# Patient Record
Sex: Female | Born: 1990 | Race: Black or African American | Hispanic: No | Marital: Single | Smoking: Never smoker
Health system: Southern US, Community
[De-identification: ages and names within clinical notes are randomized; demographics above are authoritative.]

## PROBLEM LIST (undated history)

## (undated) DIAGNOSIS — Z789 Other specified health status: Secondary | ICD-10-CM

## (undated) DIAGNOSIS — O24419 Gestational diabetes mellitus in pregnancy, unspecified control: Secondary | ICD-10-CM

## (undated) HISTORY — PX: NO PAST SURGERIES: SHX2092

## (undated) HISTORY — DX: Gestational diabetes mellitus in pregnancy, unspecified control: O24.419

## (undated) HISTORY — DX: Other specified health status: Z78.9

---

## 2018-06-03 ENCOUNTER — Encounter: Payer: Self-pay | Admitting: Family Medicine

## 2018-06-03 ENCOUNTER — Ambulatory Visit (INDEPENDENT_AMBULATORY_CARE_PROVIDER_SITE_OTHER): Payer: 59 | Admitting: *Deleted

## 2018-06-03 DIAGNOSIS — Z3200 Encounter for pregnancy test, result unknown: Secondary | ICD-10-CM

## 2018-06-03 DIAGNOSIS — Z3201 Encounter for pregnancy test, result positive: Secondary | ICD-10-CM

## 2018-06-03 DIAGNOSIS — Z3480 Encounter for supervision of other normal pregnancy, unspecified trimester: Secondary | ICD-10-CM

## 2018-06-03 LAB — POCT PREGNANCY, URINE: PREG TEST UR: POSITIVE — AB

## 2018-06-03 MED ORDER — PRENATAL 27-0.8 MG PO TABS: 1.0000 | ORAL_TABLET | Freq: Every day | ORAL | 9 refills | Status: DC

## 2018-06-03 NOTE — Progress Notes (Signed)
I have reviewed the chart and agree with nursing staff's documentation of this patient's encounter.  Jaynie Collins, MD 06/03/2018 10:07 AM

## 2018-06-03 NOTE — Progress Notes (Signed)
Here for upt which was positive. LMP 04/01/18 , states is sure of that period and periods are regular. This makes her [redacted]w[redacted]d with EDD 01/25/19. Would like to start care with one of our locations.Revewed meds- she is not on any.  Sent prenatal vitamins rx.

## 2018-07-08 ENCOUNTER — Ambulatory Visit: Payer: 59 | Admitting: *Deleted

## 2018-07-08 ENCOUNTER — Other Ambulatory Visit: Payer: Self-pay

## 2018-07-08 ENCOUNTER — Encounter: Payer: Self-pay | Admitting: General Practice

## 2018-07-08 DIAGNOSIS — Z3480 Encounter for supervision of other normal pregnancy, unspecified trimester: Secondary | ICD-10-CM | POA: Insufficient documentation

## 2018-07-08 LAB — POCT URINALYSIS DIPSTICK OB
Bilirubin, UA: NEGATIVE
Glucose, UA: NEGATIVE
Ketones, UA: NEGATIVE
NITRITE UA: NEGATIVE
PH UA: 7 (ref 5.0–8.0)
PROTEIN: NEGATIVE
Spec Grav, UA: 1.02 (ref 1.010–1.025)
UROBILINOGEN UA: 1 U/dL

## 2018-07-08 MED ORDER — VITAFOL GUMMIES 3.33-0.333-34.8 MG PO CHEW: 3.0000 | CHEWABLE_TABLET | Freq: Every day | ORAL | 12 refills | Status: DC

## 2018-07-08 NOTE — Patient Instructions (Signed)
   Genetic Screening Results Information: You are having genetic testing called [facility] today.  It will take approximately 2 weeks before the results are available.  To get your results, you need Internet access to a web browser to search [place]/MyChart (the direct app on your phone will not give you these results).  Then select Lab Scanned and click on the blue hyper link that says View Image to see your [facility] results.  You can also use the directions on the purple card given to look up your results directly on the [facility] website.  

## 2018-07-08 NOTE — Progress Notes (Signed)
   PRENATAL INTAKE SUMMARY  Becky Cochran presents today New OB Nurse Interview.  OB History    Gravida  4   Para  2   Term  2   Preterm      AB  1   Living  2     SAB      TAB      Ectopic      Multiple      Live Births  2          I have reviewed the patient's medical, obstetrical, social, and family histories, medications, and available lab results.  SUBJECTIVE She complains of headache and visual changes at least 4 times last week. Pt did not take any medication for headache relief. Denies headache and visual changes today.  Pt has history of gestational diabetes.   OBJECTIVE Initial nurse interview for history and lab work (New OB). BP elevated today (labs completed).  Blood pressure 133/86, pulse 88, temperature 98.3 F (36.8 C), height 5\' 6"  (1.676 m), weight 180 lb (81.6 kg), last menstrual period 04/20/2018.   EDD: 01/25/2019 by LMP GA: [redacted]w[redacted]d G4P2012  GENERAL APPEARANCE: alert, well appearing, in no apparent distress, oriented to person, place and time, well hydrated.   ASSESSMENT Normal pregnancy.  PLAN Prenatal care-[facility] Pnl/HIV  OB Urine Culture/Dip GC/CT/PAP at next visit with Midwife HgbEval SMA/CF ([facility]) [facility] A1C  CHTN - P/C Ratio and CMP U/S complete less <14 weeks to confirm dating and viability Prenatal Rx for gummies sent to pharmacy Pt to sign up for MyChart Advised on preterm labor pains, severe headaches, visual changes, dizziness. Pt to go to [facility].  Genetic Screening Results Information: You are having genetic testing called [facility] today.  It will take approximately 2 weeks before the results are available.  To get your results, you need Internet access to a web browser to search [place]/MyChart (the direct app on your phone will not give you these results).  Then select Lab Scanned and click on the blue hyper link that says View Image to see your [facility] results.  You can also use the directions on  the purple card given to look up your results directly on the [facility] website.  Becky Cochran, Becky Cochran L, RN

## 2018-07-09 ENCOUNTER — Encounter: Payer: Self-pay | Admitting: General Practice

## 2018-07-09 LAB — OBSTETRIC PANEL, INCLUDING HIV
Antibody Screen: NEGATIVE
BASOS ABS: 0 10*3/uL (ref 0.0–0.2)
Basos: 0 %
EOS (ABSOLUTE): 0.1 10*3/uL (ref 0.0–0.4)
Eos: 1 %
HEMATOCRIT: 34.7 % (ref 34.0–46.6)
HEP B S AG: NEGATIVE
HIV Screen 4th Generation wRfx: NONREACTIVE
Hemoglobin: 11.3 g/dL (ref 11.1–15.9)
IMMATURE GRANS (ABS): 0 10*3/uL (ref 0.0–0.1)
Immature Granulocytes: 0 %
LYMPHS: 17 %
Lymphocytes Absolute: 1.4 10*3/uL (ref 0.7–3.1)
MCH: 28.3 pg (ref 26.6–33.0)
MCHC: 32.6 g/dL (ref 31.5–35.7)
MCV: 87 fL (ref 79–97)
MONOCYTES: 5 %
Monocytes Absolute: 0.4 10*3/uL (ref 0.1–0.9)
Neutrophils Absolute: 6.3 10*3/uL (ref 1.4–7.0)
Neutrophils: 77 %
PLATELETS: 270 10*3/uL (ref 150–450)
RBC: 3.99 x10E6/uL (ref 3.77–5.28)
RDW: 13.1 % (ref 12.3–15.4)
RPR: NONREACTIVE
RUBELLA: 5.62 {index} (ref >0.99)
Rh Factor: POSITIVE
WBC: 8.2 10*3/uL (ref 3.4–10.8)

## 2018-07-09 LAB — COMPREHENSIVE METABOLIC PANEL
ALT: 8 IU/L (ref 0–32)
AST: 14 IU/L (ref 0–40)
Albumin/Globulin Ratio: 1.7 (ref 1.2–2.2)
Albumin: 4.1 g/dL (ref 3.5–5.5)
Alkaline Phosphatase: 43 IU/L (ref 39–117)
BUN/Creatinine Ratio: 11 (ref 9–23)
BUN: 7 mg/dL (ref 6–20)
Bilirubin Total: 0.6 mg/dL (ref 0.0–1.2)
CALCIUM: 9.3 mg/dL (ref 8.7–10.2)
CO2: 21 mmol/L (ref 20–29)
CREATININE: 0.61 mg/dL (ref 0.57–1.00)
Chloride: 99 mmol/L (ref 96–106)
GFR calc Af Amer: 146 mL/min/{1.73_m2} (ref >59)
GFR, EST NON AFRICAN AMERICAN: 126 mL/min/{1.73_m2} (ref >59)
GLUCOSE: 90 mg/dL (ref 65–99)
Globulin, Total: 2.4 g/dL (ref 1.5–4.5)
POTASSIUM: 3.9 mmol/L (ref 3.5–5.2)
Sodium: 135 mmol/L (ref 134–144)
Total Protein: 6.5 g/dL (ref 6.0–8.5)

## 2018-07-09 LAB — PROTEIN / CREATININE RATIO, URINE
Creatinine, Urine: 126.2 mg/dL
PROTEIN UR: 11.2 mg/dL
PROTEIN/CREAT RATIO: 89 mg/g{creat} (ref 0–200)

## 2018-07-09 LAB — HEMOGLOBIN A1C
ESTIMATED AVERAGE GLUCOSE: 108 mg/dL
Hgb A1c MFr Bld: 5.4 % (ref 4.8–5.6)

## 2018-07-09 LAB — SICKLE CELL SCREEN: Sickle Cell Screen: NEGATIVE

## 2018-07-10 ENCOUNTER — Other Ambulatory Visit: Payer: Self-pay | Admitting: Obstetrics and Gynecology

## 2018-07-10 ENCOUNTER — Ambulatory Visit (HOSPITAL_COMMUNITY): Payer: 59

## 2018-07-10 ENCOUNTER — Ambulatory Visit (HOSPITAL_COMMUNITY)
Admission: RE | Admit: 2018-07-10 | Discharge: 2018-07-10 | Disposition: A | Discharge status: Home / Self Care | Payer: 59 | Source: Ambulatory Visit | Attending: Obstetrics and Gynecology | Admitting: Obstetrics and Gynecology

## 2018-07-10 DIAGNOSIS — Z3480 Encounter for supervision of other normal pregnancy, unspecified trimester: Secondary | ICD-10-CM

## 2018-07-10 DIAGNOSIS — Z3481 Encounter for supervision of other normal pregnancy, first trimester: Secondary | ICD-10-CM | POA: Diagnosis not present

## 2018-07-10 DIAGNOSIS — Z3A12 12 weeks gestation of pregnancy: Secondary | ICD-10-CM | POA: Insufficient documentation

## 2018-07-10 LAB — URINE CULTURE, OB REFLEX

## 2018-07-10 LAB — CULTURE, OB URINE

## 2018-07-16 ENCOUNTER — Encounter: Payer: Self-pay | Admitting: General Practice

## 2018-07-17 ENCOUNTER — Encounter: Payer: Medicaid Other | Admitting: Obstetrics and Gynecology

## 2018-07-17 ENCOUNTER — Telehealth: Payer: Self-pay | Admitting: General Practice

## 2018-07-17 NOTE — Telephone Encounter (Signed)
Patient left message to reschedule appt that was scheduled for this morning.  Called patient to reschedule, but no answer.  Left message for patient to give our office a call back to reschedule.

## 2018-07-18 ENCOUNTER — Encounter: Payer: Self-pay | Admitting: General Practice

## 2018-07-19 ENCOUNTER — Other Ambulatory Visit (HOSPITAL_COMMUNITY)
Admission: RE | Admit: 2018-07-19 | Discharge: 2018-07-19 | Disposition: A | Discharge status: Home / Self Care | Payer: 59 | Source: Ambulatory Visit | Attending: Family | Admitting: Family

## 2018-07-19 ENCOUNTER — Ambulatory Visit (INDEPENDENT_AMBULATORY_CARE_PROVIDER_SITE_OTHER): Payer: 59 | Admitting: Family

## 2018-07-19 ENCOUNTER — Encounter: Payer: Self-pay | Admitting: Family

## 2018-07-19 ENCOUNTER — Encounter: Payer: Self-pay | Admitting: General Practice

## 2018-07-19 DIAGNOSIS — Z3482 Encounter for supervision of other normal pregnancy, second trimester: Secondary | ICD-10-CM | POA: Diagnosis not present

## 2018-07-19 DIAGNOSIS — Z3480 Encounter for supervision of other normal pregnancy, unspecified trimester: Secondary | ICD-10-CM | POA: Diagnosis not present

## 2018-07-19 NOTE — Patient Instructions (Addendum)
Considering Waterbirth? Guide for patients at [facility]  Why consider waterbirth?  . Gentle birth for babies . Less pain medicine used in labor . May allow for passive descent/less pushing . May reduce perineal tears  . More mobility and instinctive maternal position changes . Increased maternal relaxation . Reduced blood pressure in labor  Is waterbirth safe? What are the risks of infection, drowning or other complications?  . Infection: o Very low risk (3.7 % for tub vs 4.8% for bed) o 7 in 8000 waterbirths with documented infection o Poorly cleaned equipment most common cause o Slightly lower group B strep transmission rate  . Drowning o Maternal:  - Very low risk   - Related to seizures or fainting o Newborn:  - Very low risk. No evidence of increased risk of respiratory problems in multiple large studies - Physiological protection from breathing under water - Avoid underwater birth if there are any fetal complications - Once baby's head is out of the water, keep it out.  . Birth complication o Some reports of cord trauma, but risk decreased by bringing baby to surface gradually o No evidence of increased risk of shoulder dystocia. Mothers can usually change positions faster in water than in a bed, possibly aiding the maneuvers to free the shoulder.   You must attend a Doren Custard class at [facility]  3rd Wednesday of every month from 7-9pm  [employer] by calling [phone] or online at [email or web address]  Bring [place] the certificate from the class to your prenatal appointment  Meet with a midwife at 36 weeks to see if you can still plan a waterbirth and to sign the consent.   Purchase or rent the following supplies: You are responsible for providing all supplies listed above. **If you do not have all necessary supplies you cannot have a waterbirth.**   Water Birth Pool (Birth Pool in a Box or [place] for instance)  (Tubs start  ~$125)  Single-use disposable tub liner designed for your brand of tub  Electric drain pump to remove water (We recommend 792 gallon per hour or greater pump.)   New garden hose labeled "lead-free", "suitable for drinking water",  Separate garden hose to remove the dirty water  Fish net  Bathing suit top (optional)  Long-handled mirror (optional)  Places to purchase or rent supplies:   [employer]is for tub purchases and supplies  [employer].com for tub purchases and supplies  [employer] (www.thelaborladies.com) $275 for tub rental/set-up & take down/kit   [employer] (http://www.[place].com/.htm) Information regarding doulas (labor support) who provide pool rentals  Things that would prevent you from having a waterbirth:  Premature, <37wks  Previous cesarean birth  Presence of thick meconium-stained fluid  Multiple gestation (Twins, triplets, etc.)  Uncontrolled diabetes or gestational diabetes requiring medication  Hypertension requiring medication or diagnosis of pre-eclampsia  Heavy vaginal bleeding  Non-reassuring fetal heart rate  Active infection (MRSA, etc.). Group B Strep is NOT a contraindication for waterbirth.  If your labor has to be induced and induction method requires continuous monitoring of the baby's heart rate  Other risks/issues identified by your obstetrical provider  Please remember that birth is unpredictable. Under certain unforeseeable circumstances your provider may advise against giving birth in the tub. These decisions will be made on a case-by-case basis and with the safety of you and your baby as our highest priority.    Second Trimester of Pregnancy The second trimester is from week 14 through week  27 (months 4 through 6). The second trimester is often a time when you feel your best. Your body has adjusted to being pregnant, and you begin to feel better physically. Usually, morning  sickness has lessened or quit completely, you may have more energy, and you may have an increase in appetite. The second trimester is also a time when the fetus is growing rapidly. At the end of the sixth month, the fetus is about 9 inches long and weighs about 1 pounds. You will likely begin to feel the baby move (quickening) between 16 and 20 weeks of pregnancy. Body changes during your second trimester Your body continues to go through many changes during your second trimester. The changes vary from woman to woman.  Your weight will continue to increase. You will notice your lower abdomen bulging out.  You may begin to get stretch marks on your hips, abdomen, and breasts.  You may develop headaches that can be relieved by medicines. The medicines should be approved by your health care provider.  You may urinate more often because the fetus is pressing on your bladder.  You may develop or continue to have heartburn as a result of your pregnancy.  You may develop constipation because certain hormones are causing the muscles that push waste through your intestines to slow down.  You may develop hemorrhoids or swollen, bulging veins (varicose veins).  You may have back pain. This is caused by: ? Weight gain. ? Pregnancy hormones that are relaxing the joints in your pelvis. ? A shift in weight and the muscles that support your balance.  Your breasts will continue to grow and they will continue to become tender.  Your gums may bleed and may be sensitive to brushing and flossing.  Dark spots or blotches (chloasma, mask of pregnancy) may develop on your face. This will likely fade after the baby is born.  A dark line from your belly button to the pubic area (linea nigra) may appear. This will likely fade after the baby is born.  You may have changes in your hair. These can include thickening of your hair, rapid growth, and changes in texture. Some women also have hair loss during or after  pregnancy, or hair that feels dry or thin. Your hair will most likely return to normal after your baby is born. What to expect at prenatal visits During a routine prenatal visit:  You will be weighed to make sure you and the fetus are growing normally.  Your blood pressure will be taken.  Your abdomen will be measured to track your baby's growth.  The fetal heartbeat will be listened to.  Any test results from the previous visit will be discussed. Your health care provider may ask you:  How you are feeling.  If you are feeling the baby move.  If you have had any abnormal symptoms, such as leaking fluid, bleeding, severe headaches, or abdominal cramping.  If you are using any tobacco products, including cigarettes, chewing tobacco, and electronic cigarettes.  If you have any questions. Other tests that may be performed during your second trimester include:  Blood tests that check for: ? Low iron levels (anemia). ? High blood sugar that affects pregnant women (gestational diabetes) between 81 and 28 weeks. ? Rh antibodies. This is to check for a protein on red blood cells (Rh factor).  Urine tests to check for infections, diabetes, or protein in the urine.  An ultrasound to confirm the proper growth and development of  the baby.  An amniocentesis to check for possible genetic problems.  Fetal screens for spina bifida and Down syndrome.  HIV (human immunodeficiency virus) testing. Routine prenatal testing includes screening for HIV, unless you choose not to have this test. Follow these instructions at home: Medicines  Follow your health care provider's instructions regarding medicine use. Specific medicines may be either safe or unsafe to take during pregnancy.  Take a prenatal vitamin that contains at least 600 micrograms (mcg) of folic acid.  If you develop constipation, try taking a stool softener if your health care provider approves. Eating and drinking   Eat a  balanced diet that includes fresh fruits and vegetables, whole grains, good sources of protein such as meat, eggs, or tofu, and low-fat dairy. Your health care provider will help you determine the amount of weight gain that is right for you.  Avoid raw meat and uncooked cheese. These carry germs that can cause birth defects in the baby.  If you have low calcium intake from food, talk to your health care provider about whether you should take a daily calcium supplement.  Limit foods that are high in fat and processed sugars, such as fried and sweet foods.  To prevent constipation: ? Drink enough fluid to keep your urine clear or pale yellow. ? Eat foods that are high in fiber, such as fresh fruits and vegetables, whole grains, and beans. Activity  Exercise only as directed by your health care provider. Most women can continue their usual exercise routine during pregnancy. Try to exercise for 30 minutes at least 5 days a week. Stop exercising if you experience uterine contractions.  Avoid heavy lifting, wear low heel shoes, and practice good posture.  A sexual relationship may be continued unless your health care provider directs you otherwise. Relieving pain and discomfort  Wear a good support bra to prevent discomfort from breast tenderness.  Take warm sitz baths to soothe any pain or discomfort caused by hemorrhoids. Use hemorrhoid cream if your health care provider approves.  Rest with your legs elevated if you have leg cramps or low back pain.  If you develop varicose veins, wear support hose. Elevate your feet for 15 minutes, 3-4 times a day. Limit salt in your diet. Prenatal Care  Write down your questions. Take them to your prenatal visits.  Keep all your prenatal visits as told by your health care provider. This is important. Safety  Wear your seat belt at all times when driving.  Make a list of emergency phone numbers, including numbers for family, friends, the hospital,  and police and fire departments. General instructions  Ask your health care provider for a referral to a local prenatal education class. Begin classes no later than the beginning of month 6 of your pregnancy.  Ask for help if you have counseling or nutritional needs during pregnancy. Your health care provider can offer advice or refer you to specialists for help with various needs.  Do not use hot tubs, steam rooms, or saunas.  Do not douche or use tampons or scented sanitary pads.  Do not cross your legs for long periods of time.  Avoid cat litter boxes and soil used by cats. These carry germs that can cause birth defects in the baby and possibly loss of the fetus by miscarriage or stillbirth.  Avoid all smoking, herbs, alcohol, and unprescribed drugs. Chemicals in these products can affect the formation and growth of the baby.  Do not use any products that  contain nicotine or tobacco, such as cigarettes and e-cigarettes. If you need help quitting, ask your health care provider.  Visit your dentist if you have not gone yet during your pregnancy. Use a soft toothbrush to brush your teeth and be gentle when you floss. Contact a health care provider if:  You have dizziness.  You have mild pelvic cramps, pelvic pressure, or nagging pain in the abdominal area.  You have persistent nausea, vomiting, or diarrhea.  You have a bad smelling vaginal discharge.  You have pain when you urinate. Get help right away if:  You have a fever.  You are leaking fluid from your vagina.  You have spotting or bleeding from your vagina.  You have severe abdominal cramping or pain.  You have rapid weight gain or weight loss.  You have shortness of breath with chest pain.  You notice sudden or extreme swelling of your face, hands, ankles, feet, or legs.  You have not felt your baby move in over an hour.  You have severe headaches that do not go away when you take medicine.  You have vision  changes. Summary  The second trimester is from week 14 through week 27 (months 4 through 6). It is also a time when the fetus is growing rapidly.  Your body goes through many changes during pregnancy. The changes vary from woman to woman.  Avoid all smoking, herbs, alcohol, and unprescribed drugs. These chemicals affect the formation and growth your baby.  Do not use any tobacco products, such as cigarettes, chewing tobacco, and e-cigarettes. If you need help quitting, ask your health care provider.  Contact your health care provider if you have any questions. Keep all prenatal visits as told by your health care provider. This is important. This information is not intended to replace advice given to you by your health care provider. Make sure you discuss any questions you have with your health care provider. Document Released: 07/11/2001 Document Revised: 08/22/2016 Document Reviewed: 08/22/2016 Elsevier Interactive Patient Education  2019 [employer].

## 2018-07-19 NOTE — Progress Notes (Signed)
  Subjective:    Becky Cochran is a [id number] 9439w6d being seen today for her first obstetrical visit. Here with [facility] [place].  Her obstetrical history is significant for gestational diabetes in 2nd pregnancy.  Both pregnancies term vaginal deliveries. Patient does intend to breast feed. Pregnancy history fully reviewed.  Patient reports no complaints, improved nausea.  Vitals:   07/19/18 0823  BP: 118/82  Pulse: 78  Weight: 182 lb 6.4 oz (82.7 kg)    HISTORY: OB History  Gravida Para Term Preterm AB Living  4 2 2   1 2   SAB TAB Ectopic Multiple Live Births          2    # Outcome Date GA Lbr Len/2nd Weight Sex Delivery Anes PTL Lv  4 Current           3 AB 10/2016          2 Term 12/14/13 7345w0d  8 lb 1 oz (3.657 kg) M Vag-Spont EPI N LIV  1 Term 06/25/12 915w0d  7 lb 8 oz (3.402 kg) F Vag-Spont EPI N LIV   Past Medical History:  Diagnosis Date  . Medical history non-contributory    Past Surgical History:  Procedure Laterality Date  . NO PAST SURGERIES     Family History  Problem Relation Age of Onset  . Diabetes Mother   . Heart disease Mother   . Kidney disease Mother   . Hypertension Mother   . Cancer Maternal Grandmother   . Cancer Paternal Grandmother      Exam    BP 118/82   Pulse 78   Wt 182 lb 6.4 oz (82.7 kg)   LMP 04/20/2018 (Exact Date)   BMI 29.44 kg/m  Uterine Size: size equals dates  Pelvic Exam:    Perineum: No Hemorrhoids, Normal Perineum   Vulva: normal   Vagina:  normal mucosa, normal discharge, no palpable nodules   pH: Not done   Cervix: no bleeding following Pap, no cervical motion tenderness and no lesions   Adnexa: normal adnexa and no mass, fullness, tenderness   Bony Pelvis: Adequate  System: Breast:  No nipple retraction or dimpling, No nipple discharge or bleeding, No axillary or supraclavicular adenopathy, Normal to palpation without dominant masses   Skin: normal coloration and turgor, no rashes    Neurologic: negative   Extremities: normal strength, tone, and muscle mass   HEENT neck supple with midline trachea and thyroid without masses   Mouth/Teeth mucous membranes moist, pharynx normal without lesions   Neck supple and no masses   Cardiovascular: regular rate and rhythm, no murmurs or gallops   Respiratory:  appears well, vitals normal, no respiratory distress, acyanotic, normal RR, neck free of mass or lymphadenopathy, chest clear, no wheezing, crepitations, rhonchi, normal symmetric air entry   Abdomen: soft, non-tender; bowel sounds normal; no masses,  no organomegaly   Urinary: urethral meatus normal      Assessment:    Pregnancy: [id number] Patient Active Problem List   Diagnosis Date Noted  . Supervision of other normal pregnancy, antepartum 07/08/2018        Plan:     Reviewed initial OB labs and [facility] results Prenatal vitamins. Problem list reviewed and updated.  Ultrasound discussed; fetal survey: results reviewed.  Follow up in 4 weeks.  Eino FarberWalidah N Karim-Rhoades 07/19/2018

## 2018-07-22 ENCOUNTER — Other Ambulatory Visit: Payer: Self-pay | Admitting: Family

## 2018-07-22 DIAGNOSIS — O2340 Unspecified infection of urinary tract in pregnancy, unspecified trimester: Secondary | ICD-10-CM

## 2018-07-22 MED ORDER — NITROFURANTOIN MONOHYD MACRO 100 MG PO CAPS: 100.0000 mg | ORAL_CAPSULE | Freq: Two times a day (BID) | ORAL | 0 refills | Status: DC

## 2018-07-25 ENCOUNTER — Telehealth: Payer: Self-pay | Admitting: *Deleted

## 2018-07-25 NOTE — Telephone Encounter (Signed)
-----   Message from Amedeo GoryWalidah N Karim-Rhoades, [place] sent at 07/22/2018  6:20 PM EST ----- Regarding: UTI Meds Please call and let her know a RX has been sent to pharmacy for UTI.  I thought I already did this.

## 2018-07-26 LAB — CYTOLOGY - PAP
Chlamydia: NEGATIVE
Diagnosis: NEGATIVE
NEISSERIA GONORRHEA: NEGATIVE

## 2018-07-31 NOTE — L&D Delivery Note (Signed)
OB/[facility]N Faculty Practice Delivery Note  Becky Cochran is a 28 y.o. [id number] s/p SVD at [redacted]w[redacted]d. She was admitted for induction of labor for NRNST.   ROM: 0h 45m with clear fluid GBS Status: positive (adequately treated with PCN) Maximum Maternal Temperature: Temp (24hrs), Avg:98.6 F (37 C), Min:98.3 F (36.8 C), Max:99 F (37.2 C)  Labor Progress: . FB placed . Buccal cytotec . AROM clear fluid at 7cm then quick progression to complete  Delivery Date/Time: 01/30/19 at 0258 Delivery: Called to room and patient was complete and pushing. Head delivered LOA. No nuchal cord present. Shoulder and body delivered in usual fashion. Infant with spontaneous cry, placed on mother's abdomen, dried and stimulated. Cord clamped x 2 after 1-minute delay, and cut by father of baby. Cord blood drawn. Placenta delivered spontaneously with gentle cord traction. Fundus firm with massage and Pitocin. Labia, perineum, vagina, and cervix inspected inspected with no lacerations.   Placenta: spontaneous, intact, 3-vessel cord (to be discarded) Complications: none immediate Lacerations: none EBL: 114cc per triton Analgesia: none  Postpartum Planning [x]  message to sent to schedule follow-up  [x]  vaccines UTD  Infant: Vigorous female  APGARs 8, 46  3019g  [name] S. Juleen China, DO OB/GYN Fellow, Faculty Practice

## 2018-08-05 ENCOUNTER — Ambulatory Visit: Payer: 59 | Admitting: *Deleted

## 2018-08-05 ENCOUNTER — Other Ambulatory Visit (HOSPITAL_COMMUNITY)
Admission: RE | Admit: 2018-08-05 | Discharge: 2018-08-05 | Disposition: A | Discharge status: Home / Self Care | Payer: 59 | Source: Ambulatory Visit | Attending: Obstetrics and Gynecology | Admitting: Obstetrics and Gynecology

## 2018-08-05 VITALS — BP 121/74 | HR 80 | Temp 98.4°F | Ht 66.0 in | Wt 181.2 lb

## 2018-08-05 DIAGNOSIS — N898 Other specified noninflammatory disorders of vagina: Secondary | ICD-10-CM

## 2018-08-05 DIAGNOSIS — O26899 Other specified pregnancy related conditions, unspecified trimester: Secondary | ICD-10-CM | POA: Insufficient documentation

## 2018-08-05 NOTE — Progress Notes (Signed)
   SUBJECTIVE:  28 y.o. female complains of clear and foul vaginal discharge for 10 day(s). Denies abnormal vaginal bleeding or significant pelvic pain or fever. No UTI symptoms. Denies history of known exposure to STD.  Patient's last menstrual period was 04/20/2018 (exact date).  OBJECTIVE:  She appears well, afebrile. Urine dipstick: not done.  ASSESSMENT:  Vaginal Discharge  Vaginal Odor   PLAN:  GC, chlamydia, trichomonas, BVAG, CVAG probe sent to lab. Treatment: To be determined once lab results are received ROV prn if symptoms persist or worsen.   Clovis PuMartin, Braeden Dolinski L, RN

## 2018-08-06 LAB — CERVICOVAGINAL ANCILLARY ONLY
Bacterial vaginitis: POSITIVE — AB
Candida vaginitis: POSITIVE — AB
Chlamydia: NEGATIVE
Neisseria Gonorrhea: NEGATIVE
Trichomonas: NEGATIVE

## 2018-08-08 ENCOUNTER — Telehealth: Payer: Self-pay | Admitting: *Deleted

## 2018-08-08 DIAGNOSIS — B9689 Other specified bacterial agents as the cause of diseases classified elsewhere: Secondary | ICD-10-CM

## 2018-08-08 DIAGNOSIS — B3731 Acute candidiasis of vulva and vagina: Secondary | ICD-10-CM

## 2018-08-08 DIAGNOSIS — B373 Candidiasis of vulva and vagina: Secondary | ICD-10-CM

## 2018-08-08 DIAGNOSIS — O23599 Infection of other part of genital tract in pregnancy, unspecified trimester: Principal | ICD-10-CM

## 2018-08-08 MED ORDER — METRONIDAZOLE 500 MG PO TABS: 500.0000 mg | ORAL_TABLET | Freq: Two times a day (BID) | ORAL | 0 refills | Status: DC

## 2018-08-08 MED ORDER — TERCONAZOLE 0.4 % VA CREA: 1.0000 | TOPICAL_CREAM | Freq: Every day | VAGINAL | 0 refills | Status: DC

## 2018-08-08 NOTE — Telephone Encounter (Signed)
-----   Message from Marti Sleigh, [place] sent at 08/08/2018  9:42 AM EST ----- Regarding: test results Wants test results

## 2018-08-08 NOTE — Telephone Encounter (Signed)
Patient called requesting lab results from 08/05/2018.  Pt verified DOB. Patient result positive for BV and yeast. Pt also stated she is starting to have UTI symptoms; painful urination. Appt scheduled for nurse visit tomorrow 08/09/2018. Metronidazole 500 mg 1 tab BID with food and Terazol vaginal cream sent to pharmacy.  Clovis Pu, RN

## 2018-08-09 ENCOUNTER — Ambulatory Visit: Payer: 59

## 2018-08-15 ENCOUNTER — Encounter (HOSPITAL_COMMUNITY): Payer: Self-pay | Admitting: *Deleted

## 2018-08-15 ENCOUNTER — Emergency Department (HOSPITAL_COMMUNITY): Payer: 59

## 2018-08-15 ENCOUNTER — Emergency Department (HOSPITAL_COMMUNITY)
Admission: EM | Admit: 2018-08-15 | Discharge: 2018-08-15 | Disposition: A | Discharge status: Home / Self Care | Payer: 59 | Attending: Emergency Medicine | Admitting: Emergency Medicine

## 2018-08-15 ENCOUNTER — Other Ambulatory Visit: Payer: Self-pay

## 2018-08-15 DIAGNOSIS — N3000 Acute cystitis without hematuria: Secondary | ICD-10-CM | POA: Insufficient documentation

## 2018-08-15 DIAGNOSIS — R52 Pain, unspecified: Secondary | ICD-10-CM

## 2018-08-15 DIAGNOSIS — Z79899 Other long term (current) drug therapy: Secondary | ICD-10-CM | POA: Insufficient documentation

## 2018-08-15 DIAGNOSIS — R109 Unspecified abdominal pain: Secondary | ICD-10-CM

## 2018-08-15 DIAGNOSIS — R1011 Right upper quadrant pain: Secondary | ICD-10-CM | POA: Diagnosis present

## 2018-08-15 LAB — COMPREHENSIVE METABOLIC PANEL
ALK PHOS: 41 U/L (ref 38–126)
ALT: 11 U/L (ref 0–44)
AST: 16 U/L (ref 15–41)
Albumin: 3.4 g/dL — ABNORMAL LOW (ref 3.5–5.0)
Anion gap: 9 (ref 5–15)
BILIRUBIN TOTAL: 0.8 mg/dL (ref 0.3–1.2)
BUN: 10 mg/dL (ref 6–20)
CO2: 25 mmol/L (ref 22–32)
Calcium: 8.9 mg/dL (ref 8.9–10.3)
Chloride: 101 mmol/L (ref 98–111)
Creatinine, Ser: 0.62 mg/dL (ref 0.44–1.00)
GFR calc Af Amer: >60 mL/min (ref >60)
GFR calc non Af Amer: >60 mL/min (ref >60)
Glucose, Bld: 88 mg/dL (ref 70–99)
Potassium: 3.5 mmol/L (ref 3.5–5.1)
Sodium: 135 mmol/L (ref 135–145)
Total Protein: 7 g/dL (ref 6.5–8.1)

## 2018-08-15 LAB — CBC WITH DIFFERENTIAL/PLATELET
Abs Immature Granulocytes: 0.12 10*3/uL — ABNORMAL HIGH (ref 0.00–0.07)
Basophils Absolute: 0 10*3/uL (ref 0.0–0.1)
Basophils Relative: 0 %
Eosinophils Absolute: 0.2 10*3/uL (ref 0.0–0.5)
Eosinophils Relative: 1 %
HCT: 35.5 % — ABNORMAL LOW (ref 36.0–46.0)
Hemoglobin: 11.4 g/dL — ABNORMAL LOW (ref 12.0–15.0)
Immature Granulocytes: 1 %
LYMPHS ABS: 1.9 10*3/uL (ref 0.7–4.0)
Lymphocytes Relative: 14 %
MCH: 28.8 pg (ref 26.0–34.0)
MCHC: 32.1 g/dL (ref 30.0–36.0)
MCV: 89.6 fL (ref 80.0–100.0)
Monocytes Absolute: 0.8 10*3/uL (ref 0.1–1.0)
Monocytes Relative: 6 %
NRBC: 0 % (ref 0.0–0.2)
Neutro Abs: 10.6 10*3/uL — ABNORMAL HIGH (ref 1.7–7.7)
Neutrophils Relative %: 78 %
Platelets: 266 10*3/uL (ref 150–400)
RBC: 3.96 MIL/uL (ref 3.87–5.11)
RDW: 12.8 % (ref 11.5–15.5)
WBC: 13.6 10*3/uL — ABNORMAL HIGH (ref 4.0–10.5)

## 2018-08-15 LAB — HCG, QUANTITATIVE, PREGNANCY: hCG, Beta Chain, Quant, S: 27706 m[IU]/mL — ABNORMAL HIGH (ref <5)

## 2018-08-15 LAB — URINALYSIS, ROUTINE W REFLEX MICROSCOPIC
Bilirubin Urine: NEGATIVE
Glucose, UA: NEGATIVE mg/dL
Ketones, ur: NEGATIVE mg/dL
Nitrite: NEGATIVE
Protein, ur: 30 mg/dL — AB
Specific Gravity, Urine: 1.013 (ref 1.005–1.030)
WBC, UA: >50 WBC/hpf — ABNORMAL HIGH (ref 0–5)
pH: 6 (ref 5.0–8.0)

## 2018-08-15 LAB — LIPASE, BLOOD: Lipase: 38 U/L (ref 11–51)

## 2018-08-15 LAB — I-STAT BETA HCG BLOOD, ED (MC, WL, AP ONLY): I-stat hCG, quantitative: >2000 m[IU]/mL — ABNORMAL HIGH (ref <5)

## 2018-08-15 MED ORDER — SODIUM CHLORIDE 0.9 % IV SOLN
INTRAVENOUS | Status: DC | PRN
  Administered 2018-08-15: 500 mL via INTRAVENOUS

## 2018-08-15 MED ORDER — CEPHALEXIN 500 MG PO CAPS: 500.0000 mg | ORAL_CAPSULE | Freq: Four times a day (QID) | ORAL | 0 refills | Status: AC

## 2018-08-15 MED ORDER — SODIUM CHLORIDE 0.9 % IV SOLN
1.0000 g | Freq: Once | INTRAVENOUS | Status: AC
  Administered 2018-08-15: 1 g via INTRAVENOUS
  Filled 2018-08-15: qty 10

## 2018-08-15 MED ORDER — ACETAMINOPHEN 325 MG PO TABS
650.0000 mg | ORAL_TABLET | Freq: Once | ORAL | Status: AC
  Administered 2018-08-15: 650 mg via ORAL
  Filled 2018-08-15: qty 2

## 2018-08-15 MED ORDER — SODIUM CHLORIDE 0.9 % IV BOLUS
1000.0000 mL | Freq: Once | INTRAVENOUS | Status: AC
  Administered 2018-08-15: 1000 mL via INTRAVENOUS

## 2018-08-15 MED ORDER — MORPHINE SULFATE (PF) 4 MG/ML IV SOLN: 4.0000 mg | Freq: Once | INTRAVENOUS | Status: DC

## 2018-08-15 NOTE — ED Provider Notes (Signed)
[facility]-EMERGENCY DEPT Provider Note   CSN: [id number] Arrival date & time: 08/15/18  0530   History   Chief Complaint Chief Complaint  Patient presents with  . Flank Pain    right    HPI Becky Cochran is a 28 y.o. female with past medical history significant for pregnancy [redacted]w[redacted]d who presents for evaluation of flank and RUQ abdominal pain. Patient states she was awoken at 3am this morning with right sided flank pain that radiates into her upper abdomen. Rates her pain a 6/10. States movement and laying on her side makes the pain worse. Has not tried anything for her symptoms PTA. Denies hx of kidney stones and gallbladder issues. States she has never had similar symptoms. Denies fever, chills, nausea, vomiting, chest pain, shortness of breath, abdominal cramping, lower abdominal pain, vaginal bleeding, pelvic pain, dysuria or diarrhea.  Of note, patient states she was treated approximately 9 days ago for urinary tract infection with macrobid.  Patient states she denies symptoms at that time it was made aware of her positive urinalysis at her prenatal visit. Denies previous abdominal surgeries.   History obtained by patient and significant other.  No interpreter was used.  HPI  Past Medical History:  Diagnosis Date  . Medical history non-contributory     Patient Active Problem List   Diagnosis Date Noted  . Supervision of other normal pregnancy, antepartum 07/08/2018    Past Surgical History:  Procedure Laterality Date  . NO PAST SURGERIES       OB History    Gravida  4   Para  2   Term  2   Preterm      AB  1   Living  2     SAB      TAB      Ectopic      Multiple      Live Births  2            Home Medications    Prior to Admission medications   Medication Sig Start Date End Date Taking? Authorizing Provider  Prenatal Vit-Fe Phos-FA-Omega (VITAFOL GUMMIES) 3.33-0.333-34.8 MG CHEW Chew 3 each by mouth daily. 07/08/18  Yes  Arita Miss, Rolitta, CNM  cephALEXin (KEFLEX) 500 MG capsule Take 1 capsule (500 mg total) by mouth 4 (four) times daily for 10 days. 08/15/18 08/25/18  Petrita Blunck A, PA-C  metroNIDAZOLE (FLAGYL) 500 MG tablet Take 1 tablet (500 mg total) by mouth 2 (two) times daily. Patient not taking: Reported on 08/15/2018 08/08/18   Amedeo Gory, CNM  nitrofurantoin, macrocrystal-monohydrate, (MACROBID) 100 MG capsule Take 1 capsule (100 mg total) by mouth 2 (two) times daily. Patient not taking: Reported on 08/15/2018 07/22/18   Amedeo Gory, CNM  Prenatal Vit-Fe Fumarate-FA (MULTIVITAMIN-PRENATAL) 27-0.8 MG TABS tablet Take 1 tablet by mouth daily at 12 noon. Patient not taking: Reported on 08/15/2018 06/03/18   Tereso Newcomer, MD  terconazole (TERAZOL 7) 0.4 % vaginal cream Place 1 applicator vaginally at bedtime. Patient not taking: Reported on 08/15/2018 08/08/18   Amedeo Gory, CNM    Family History Family History  Problem Relation Age of Onset  . Diabetes Mother   . Heart disease Mother   . Kidney disease Mother   . Hypertension Mother   . Cancer Maternal Grandmother   . Cancer Paternal Grandmother     Social History Social History   Tobacco Use  . Smoking status: Never Smoker  . Smokeless tobacco: Never  Used  Substance Use Topics  . Alcohol use: Not Currently    Comment: socially  . Drug use: Never     Allergies   Patient has no known allergies.   Review of Systems Review of Systems  Constitutional: Negative.   HENT: Negative.   Respiratory: Negative.   Cardiovascular: Negative.   Gastrointestinal: Negative for abdominal distention, abdominal pain, anal bleeding, blood in stool, constipation, diarrhea, nausea, rectal pain and vomiting.  Genitourinary: Positive for flank pain.  Skin: Negative.   Neurological: Negative.   All other systems reviewed and are negative.    Physical Exam Updated Vital Signs BP 120/65   Pulse 76   Temp 98.2  F (36.8 C) (Oral)   Resp 18   Ht 5\' 6"  (1.676 m)   Wt 82.1 kg   LMP 04/20/2018 (Exact Date)   SpO2 98%   BMI 29.21 kg/m   Physical Exam Vitals signs and nursing note reviewed.  Constitutional:      General: She is not in acute distress.    Appearance: She is well-developed. She is not ill-appearing, toxic-appearing or diaphoretic.  HENT:     Head: Normocephalic and atraumatic.     Nose: Nose normal.     Right Sinus: No maxillary sinus tenderness or frontal sinus tenderness.     Left Sinus: No maxillary sinus tenderness or frontal sinus tenderness.     Mouth/Throat:     Lips: Pink.     Mouth: Mucous membranes are moist.     Pharynx: Oropharynx is clear. Uvula midline. No pharyngeal swelling, oropharyngeal exudate, posterior oropharyngeal erythema or uvula swelling.     Tonsils: No tonsillar exudate or tonsillar abscesses. Swelling: 0 on the right. 0 on the left.  Eyes:     Pupils: Pupils are equal, round, and reactive to light.  Neck:     Musculoskeletal: Full passive range of motion without pain, normal range of motion and neck supple.     Trachea: Trachea and phonation normal.  Cardiovascular:     Rate and Rhythm: Normal rate.     Pulses: Normal pulses.     Heart sounds: Normal heart sounds. No murmur. No friction rub. No gallop.   Pulmonary:     Effort: Pulmonary effort is normal. No respiratory distress.     Breath sounds: Normal breath sounds and air entry.     Comments: CTA without wheeze, rhonchi or rales.  No evidence of acute respiratory distress. Abdominal:     General: Bowel sounds are normal. There is no distension.     Palpations: Abdomen is soft.     Tenderness: There is no abdominal tenderness. There is no right CVA tenderness, left CVA tenderness, guarding or rebound. Negative signs include Murphy's sign, Rovsing's sign, McBurney's sign, psoas sign and obturator sign.     Hernia: No hernia is present.     Comments: Soft with no rebound or guarding.  She has  mild right upper quadrant tenderness.  Negative CVA tenderness bilaterally.  Musculoskeletal: Normal range of motion.     Comments: Moves all extremities without difficulty. Ambulatory in department without difficulty.  Skin:    General: Skin is warm and dry.     Comments: No rashes or lesions  Neurological:     Mental Status: She is alert.      ED Treatments / Results  Labs (all labs ordered are listed, but only abnormal results are displayed) Labs Reviewed  URINALYSIS, ROUTINE W REFLEX MICROSCOPIC - Abnormal; Notable for the  following components:      Result Value   APPearance HAZY (*)    Hgb urine dipstick SMALL (*)    Protein, ur 30 (*)    Leukocytes, UA LARGE (*)    WBC, UA >50 (*)    Bacteria, UA RARE (*)    All other components within normal limits  HCG, QUANTITATIVE, PREGNANCY - Abnormal; Notable for the following components:   hCG, Beta Chain, Quant, S 27,706 (*)    All other components within normal limits  CBC WITH DIFFERENTIAL/PLATELET - Abnormal; Notable for the following components:   WBC 13.6 (*)    Hemoglobin 11.4 (*)    HCT 35.5 (*)    Neutro Abs 10.6 (*)    Abs Immature Granulocytes 0.12 (*)    All other components within normal limits  COMPREHENSIVE METABOLIC PANEL - Abnormal; Notable for the following components:   Albumin 3.4 (*)    All other components within normal limits  I-STAT BETA HCG BLOOD, ED ([facility], [facility], [facility] ONLY) - Abnormal; Notable for the following components:   I-stat hCG, quantitative >2,000.0 (*)    All other components within normal limits  URINE CULTURE  LIPASE, BLOOD    EKG None  Radiology [place] Renal  Result Date: 08/15/2018 CLINICAL DATA:  Pain.  Pregnancy. EXAM: RENAL / URINARY TRACT ULTRASOUND COMPLETE COMPARISON:  Ultrasound 07/10/2018 FINDINGS: Right Kidney: Renal measurements: 12.4 x 5.3 x 6.6 cm = volume: 272.2 mL . Echogenicity within normal limits. No mass. Mild right hydronephrosis. Hydronephrosis remains after voiding. Left  Kidney: Renal measurements: 11.6 x 5.2 x 6.1 cm = volume: 193.7 mL. Echogenicity within normal limits. No mass or hydronephrosis visualized. Bladder: Appears normal for degree of bladder distention. IMPRESSION: Mild right hydronephrosis. Electronically Signed   By: Maisie Fushomas  Register   On: 08/15/2018 09:26   [place] Abdomen Limited Ruq  Result Date: 08/15/2018 CLINICAL DATA:  Abdominal pain EXAM: ULTRASOUND ABDOMEN LIMITED RIGHT UPPER QUADRANT COMPARISON:  None. FINDINGS: Gallbladder: No gallstones or wall thickening visualized. There is no pericholecystic fluid. No sonographic Murphy sign noted by sonographer. Common bile duct: Diameter: 4 mm. No intrahepatic or extrahepatic biliary duct dilatation. Liver: No focal lesion identified. Within normal limits in parenchymal echogenicity. Portal vein is patent on color Doppler imaging with normal direction of blood flow towards the liver. IMPRESSION: Study within normal limits. Electronically Signed   By: Bretta BangWilliam  Woodruff III M.D.   On: 08/15/2018 09:42    Procedures Procedures (including critical care time)  Medications Ordered in ED Medications  0.9 %  sodium chloride infusion ( Intravenous Stopped 08/15/18 1126)  acetaminophen (TYLENOL) tablet 650 mg (650 mg Oral Given 08/15/18 0750)  sodium chloride 0.9 % bolus 1,000 mL (0 mLs Intravenous Stopped 08/15/18 1128)  cefTRIAXone (ROCEPHIN) 1 g in sodium chloride 0.9 % 100 mL IVPB (0 g Intravenous Stopped 08/15/18 1128)     Initial Impression / Assessment and Plan / ED Course  I have reviewed the triage vital signs and the nursing notes.  Pertinent labs & imaging results that were available during my care of the patient were reviewed by me and considered in my medical decision making (see chart for details).  28 year old female appears otherwise well presents for evaluation of right upper quadrant abdominal pain and flank pain. Onset 3 hours PTA. Pain worse with movement and laying on right side. Abdomen with  mild tenderness in RUQ. No rebound or guarding. Negative CVA tenderness. Recently treated for a UTI 9 days ago with macrobid.  Afebrile, non septic, non ill appearing. Will obtain labs, urine and re-evaluate.  She does not want anything for pain at this time.  Fetal heart tones 151.  No lower abdominal pain or vaginal bleeding.  Low suspicion for pyelonephritis given intermittent nature of pain.  CBC with leukocytosis 13.6, urinalysis positive for infection, Metabolic panel without electrolyte, renal or liver abnormalities, lipase 38, hCG 27,000.  Ultrasound right upper quadrant negative.  Ultrasound renal shows right-sided mild hydronephrosis. Given positive urinalysis for infection with possible stone-like symptoms concern for possible infected stone.  Will consult with urology for reevaluation.  Consulted with Urology, Dr. Mena Goes. Recommends close outpatient follow-up with 2-week course of Keflex.  Patient does not meet Sirs or sepsis criteria.  She is afebrile.  Patient states she would like to attempt outpatient management if possible. Dr. Mena Goes recommends strict return precautions. Abdomen soft, nontender without rebound or guarding on reevaluation.  No peritoneal signs.  Give patient liter of fluids as well as 1 g Rocephin IV.  Able to tolerate p.o. intake in department without difficulty.  Patient is hemodynamically stable and appropriate for DC home at this time.  Will DC patient home with 10-day course of Keflex.  Thoroughly discussed strict return precautions with patient and family member.  Patient and family voiced understanding and are agreeable for follow-up.  States she does not want any narcotics to take home.  She will take Tylenol as needed.  Will DC home with antibiotics and have patient follow-up closely with urology. Low suspicion for emergent pathology causing patient's symptoms at this time.    Final Clinical Impressions(s) / ED Diagnoses   Final diagnoses:  Pain  Flank pain    Acute cystitis without hematuria    ED Discharge Orders         Ordered    cephALEXin (KEFLEX) 500 MG capsule  4 times daily     08/15/18 1140           Zimri Brennen A, PA-C 08/15/18 1450    Mesner, Barbara Cower, MD 08/15/18 2309

## 2018-08-15 NOTE — ED Triage Notes (Signed)
Pt states she woke at around 3am with sharp flank pain.  Pt states that she was recently on antibiotics for a UTI and BV.  Pt is also [redacted] weeks pregnant.  Pt states that certain positions make the pain better.  Pt denies taking any medications.  Pt also denies any other symptoms.

## 2018-08-15 NOTE — Discharge Instructions (Signed)
Evaluated today for flank pain.  Urinalysis was positive for infection.  It is possible that you have an infected stone versus pyelonephritis.  I have given you antibiotics in department and a send you home with antibiotics.  You will need follow-up with urology.  Please return to emergency department if you develop fever, nausea, vomiting, worsening flank pain.

## 2018-08-17 LAB — URINE CULTURE: Culture: >=100000 — AB

## 2018-08-18 ENCOUNTER — Telehealth: Payer: Self-pay | Admitting: Emergency Medicine

## 2018-08-18 NOTE — Telephone Encounter (Signed)
Post ED Visit - Positive Culture Follow-up  Culture report reviewed by antimicrobial stewardship pharmacist:  []  Enzo Bi, Pharm.D. []  Celedonio Miyamoto, Pharm.D., BCPS AQ-ID []  Garvin Fila, Pharm.D., BCPS []  Georgina Pillion, Pharm.D., BCPS []  [place], [address].D., BCPS, AAHIVP []  Estella Husk, Pharm.D., BCPS, AAHIVP []  Lysle Pearl, PharmD, BCPS []  Phillips Climes, PharmD, BCPS []  Agapito Games, PharmD, BCPS [x]  Verlan Friends, PharmD  Positive urine culture Treated with Cephalexin, organism sensitive to the same and no further patient follow-up is required at this time.  Norm Parcel RN 08/18/2018, 1:25 PM

## 2018-08-23 ENCOUNTER — Ambulatory Visit (INDEPENDENT_AMBULATORY_CARE_PROVIDER_SITE_OTHER): Payer: 59 | Admitting: Obstetrics and Gynecology

## 2018-08-23 ENCOUNTER — Encounter: Payer: Self-pay | Admitting: Obstetrics and Gynecology

## 2018-08-23 VITALS — BP 120/84 | HR 69 | Wt 188.0 lb

## 2018-08-23 DIAGNOSIS — Z3482 Encounter for supervision of other normal pregnancy, second trimester: Secondary | ICD-10-CM

## 2018-08-23 DIAGNOSIS — Z3480 Encounter for supervision of other normal pregnancy, unspecified trimester: Secondary | ICD-10-CM

## 2018-08-23 NOTE — Progress Notes (Addendum)
   PRENATAL VISIT NOTE  Subjective:  Becky Cochran is a 28 y.o. [phone] at 614w6d being seen today for ongoing prenatal care.  She is currently monitored for the following issues for this low-risk pregnancy and has Supervision of other normal pregnancy, antepartum on their problem list.  Patient reports no complaints. Recently diagnosed with kidney stone and taking abx for that. She has no h/o kidney stone; this was the first diagnosis. She reports that the pain was really bad while she was in the ED, but "went away the next day."  Contractions: Not present. Vag. Bleeding: None.  Movement: Present. Denies leaking of fluid.   The following portions of the patient's history were reviewed and updated as appropriate: allergies, current medications, past family history, past medical history, past social history, past surgical history and problem list. Problem list updated.  Objective:   Vitals:   08/23/18 0833  BP: 120/84  Pulse: 69  Weight: 188 lb (85.3 kg)    Fetal Status: Fetal Heart Rate (bpm): 140 Fundal Height: 17 cm Movement: Present     General:  Alert, oriented and cooperative. Patient is in no acute distress.  Skin: Skin is warm and dry. No rash noted.   Cardiovascular: Normal heart rate noted  Respiratory: Normal respiratory effort, no problems with respiration noted  Abdomen: Soft, gravid, appropriate for gestational age.  Pain/Pressure: Absent     Pelvic: Cervical exam deferred        Extremities: Normal range of motion.  Edema: None  Mental Status: Normal mood and affect. Normal behavior. Normal judgment and thought content.   Assessment and Plan:  Pregnancy: [id number] at 3514w6d  1. Supervision of other normal pregnancy, antepartum - Discussed kidney stone passage - US MFM OB COMP + 14 WK; Future - Advised to call office for any further problems or concerns - Scheduled to see [facility] on Friday 08/31/2018  Preterm labor symptoms and general obstetric precautions  including but not limited to vaginal bleeding, contractions, leaking of fluid and fetal movement were reviewed in detail with the patient. Please refer to After Visit Summary for other counseling recommendations.  Return in about 4 weeks (around 09/20/2018) for Return OB visit.   Raelyn Moraolitta Henley Boettner, CNM

## 2018-08-23 NOTE — Patient Instructions (Signed)

## 2018-08-26 ENCOUNTER — Encounter (HOSPITAL_COMMUNITY): Payer: Self-pay

## 2018-08-28 ENCOUNTER — Encounter: Payer: Self-pay | Admitting: General Practice

## 2018-09-02 ENCOUNTER — Ambulatory Visit (HOSPITAL_COMMUNITY)
Admission: RE | Admit: 2018-09-02 | Discharge: 2018-09-02 | Disposition: A | Discharge status: Home / Self Care | Payer: 59 | Source: Ambulatory Visit | Attending: Obstetrics and Gynecology | Admitting: Obstetrics and Gynecology

## 2018-09-02 ENCOUNTER — Other Ambulatory Visit (HOSPITAL_COMMUNITY): Payer: Self-pay | Admitting: *Deleted

## 2018-09-02 DIAGNOSIS — Z363 Encounter for antenatal screening for malformations: Secondary | ICD-10-CM | POA: Diagnosis not present

## 2018-09-02 DIAGNOSIS — Z3480 Encounter for supervision of other normal pregnancy, unspecified trimester: Secondary | ICD-10-CM | POA: Diagnosis present

## 2018-09-02 DIAGNOSIS — Z3A19 19 weeks gestation of pregnancy: Secondary | ICD-10-CM

## 2018-09-02 DIAGNOSIS — Z362 Encounter for other antenatal screening follow-up: Secondary | ICD-10-CM

## 2018-09-20 ENCOUNTER — Encounter: Payer: 59 | Admitting: Family

## 2018-09-30 ENCOUNTER — Ambulatory Visit (HOSPITAL_COMMUNITY)
Admission: RE | Admit: 2018-09-30 | Discharge: 2018-09-30 | Disposition: A | Discharge status: Home / Self Care | Payer: 59 | Source: Ambulatory Visit | Attending: Obstetrics and Gynecology | Admitting: Obstetrics and Gynecology

## 2018-09-30 DIAGNOSIS — Z362 Encounter for other antenatal screening follow-up: Secondary | ICD-10-CM | POA: Insufficient documentation

## 2018-09-30 DIAGNOSIS — Z3A23 23 weeks gestation of pregnancy: Secondary | ICD-10-CM | POA: Diagnosis not present

## 2018-10-03 ENCOUNTER — Ambulatory Visit (INDEPENDENT_AMBULATORY_CARE_PROVIDER_SITE_OTHER): Payer: 59 | Admitting: Obstetrics and Gynecology

## 2018-10-03 ENCOUNTER — Other Ambulatory Visit: Payer: Self-pay

## 2018-10-03 VITALS — BP 117/75 | HR 63 | Wt 202.4 lb

## 2018-10-03 DIAGNOSIS — Z3482 Encounter for supervision of other normal pregnancy, second trimester: Secondary | ICD-10-CM

## 2018-10-03 DIAGNOSIS — Z3A23 23 weeks gestation of pregnancy: Secondary | ICD-10-CM

## 2018-10-03 NOTE — Progress Notes (Signed)
   PRENATAL VISIT NOTE  Subjective:  Becky Cochran is a 28 y.o. [phone] at [redacted]w[redacted]d being seen today for ongoing prenatal care.  She is currently monitored for the following issues for this low-risk pregnancy and has Supervision of other normal pregnancy, antepartum on their problem list.  Patient reports no complaints, but is concerned about her weight gain over this past month. She gained 12lbs in one week (around 21wks). She has a previous history of obesity and lost weight, so this was upsetting to her. Dietary recall showed a high carb, low protein diet.  Contractions: Not present. Vag. Bleeding: None.  Movement: Absent. Denies leaking of fluid.   The following portions of the patient's history were reviewed and updated as appropriate: allergies, current medications, past family history, past medical history, past social history, past surgical history and problem list. Problem list updated.  Objective:   Vitals:   10/03/18 1644  BP: 117/75  Pulse: 63  Weight: 91.8 kg    Fetal Status: Fetal Heart Rate (bpm): 156 Fundal Height: 24 cm Movement: Absent     General:  Alert, oriented and cooperative. Patient is in no acute distress.  Skin: Skin is warm and dry. No rash noted.   Cardiovascular: Normal heart rate noted  Respiratory: Normal respiratory effort, no problems with respiration noted  Abdomen: Soft, gravid, appropriate for gestational age.  Pain/Pressure: Absent     Pelvic: Cervical exam deferred        Extremities: Normal range of motion.  Edema: None  Mental Status: Normal mood and affect. Normal behavior. Normal judgment and thought content.   Assessment and Plan:  Pregnancy: [id number] at [redacted]w[redacted]d  1. Encounter for supervision of other normal pregnancy in second trimester - Discussed nutrition in pregnancy and importance of eating frequent, high protein meals with complex carbohydrates. Encouraged her to focus on fueling her body and not try to lose weight while pregnant. Helped  her brainstorm high protein meals that will fit her diet (she is mainly pescatarian). - Anticipatory guidance given re: next visit including need for fasting and length of visit  Preterm labor symptoms and general obstetric precautions including but not limited to vaginal bleeding, contractions, leaking of fluid and fetal movement were reviewed in detail with the patient. Please refer to After Visit Summary for other counseling recommendations.  Return in about 4 weeks (around 10/31/2018) for ROB & GTT.  Future Appointments  Date Time Provider Department Center  11/01/2018  8:30 AM [facility] None  11/01/2018  9:10 AM Karim-Rhoades, Kae Heller, CNM [facility] None    Bernerd Limbo, Student-MidWife

## 2018-10-04 ENCOUNTER — Encounter: Payer: 59 | Admitting: Family

## 2018-10-05 ENCOUNTER — Encounter: Payer: Self-pay | Admitting: Obstetrics and Gynecology

## 2018-11-01 ENCOUNTER — Telehealth: Payer: Self-pay | Admitting: General Practice

## 2018-11-01 ENCOUNTER — Other Ambulatory Visit: Payer: Self-pay

## 2018-11-01 ENCOUNTER — Ambulatory Visit (INDEPENDENT_AMBULATORY_CARE_PROVIDER_SITE_OTHER): Payer: 59 | Admitting: Family

## 2018-11-01 ENCOUNTER — Other Ambulatory Visit: Payer: 59

## 2018-11-01 DIAGNOSIS — Z3482 Encounter for supervision of other normal pregnancy, second trimester: Secondary | ICD-10-CM

## 2018-11-01 DIAGNOSIS — Z3A27 27 weeks gestation of pregnancy: Secondary | ICD-10-CM

## 2018-11-01 DIAGNOSIS — Z3480 Encounter for supervision of other normal pregnancy, unspecified trimester: Secondary | ICD-10-CM

## 2018-11-01 NOTE — Telephone Encounter (Signed)
Pt called stating that she will not be able to have 2hr gtts testing today d/t child care issues.  Will call next week to schedule 2hr.  Informed pt to get BP cuff from [facility], that we can conduct Webex/Telehealth visit.  Pt was informed that we would call her back today for Telehealth visit.  Pt verbalized understanding.

## 2018-11-01 NOTE — Progress Notes (Signed)
   TELEHEALTH VIRTUAL OBSTETRICS VISIT ENCOUNTER NOTE  I connected with Becky Cochran on 11/01/18 at  9:10 AM EDT by telephone at home and verified that I am speaking with the correct person using two identifiers.   I discussed the limitations, risks, security and privacy concerns of performing an evaluation and management service by telephone and the availability of in person appointments. I also discussed with the patient that there may be a patient responsible charge related to this service. The patient expressed understanding and agreed to proceed.  Subjective:  Becky Cochran is a 28 y.o. [phone] at [redacted]w[redacted]d being followed for ongoing prenatal care.  She is currently monitored for the following issues for this low-risk pregnancy and has Supervision of other normal pregnancy, antepartum on their problem list.  Patient reports no complaints. Concerned due to working in pharmacy.  Reports fetal movement. Denies any contractions, bleeding or leaking of fluid.   The following portions of the patient's history were reviewed and updated as appropriate: allergies, current medications, past family history, past medical history, past social history, past surgical history and problem list.   Objective:   General:  Alert, oriented and cooperative.   Mental Status: Normal mood and affect perceived. Normal judgment and thought content.  Rest of physical exam deferred due to type of encounter  Assessment and Plan:  Pregnancy: [id number] at [redacted]w[redacted]d  1.  Supervision of Normal Pregnancy - Discussed telehealth/virtual visits platform - Downloaded BabyScripts and WebEx; plans to purchase automatic cuff - Reviewed strategies to reduce transmission - Plans to come in next week for 28 wks labs  Preterm labor symptoms and general obstetric precautions including but not limited to vaginal bleeding, contractions, leaking of fluid and fetal movement were reviewed in detail with the patient.  I discussed the  assessment and treatment plan with the patient. The patient was provided an opportunity to ask questions and all were answered. The patient agreed with the plan and demonstrated an understanding of the instructions. The patient was advised to call back or seek an in-person office evaluation/go to [facility] at [facility] for any urgent or concerning symptoms. Please refer to After Visit Summary for other counseling recommendations.   I provided 10 minutes of non-face-to-face time during this encounter.  No follow-ups on file.  No future appointments.  Amedeo Gory, CNM [facility] [employer], [facility]

## 2018-11-04 ENCOUNTER — Telehealth: Payer: Self-pay | Admitting: *Deleted

## 2018-11-04 DIAGNOSIS — Z3480 Encounter for supervision of other normal pregnancy, unspecified trimester: Secondary | ICD-10-CM

## 2018-11-04 MED ORDER — BLOOD PRESSURE MONITOR/M CUFF MISC: 1.0000 | 0 refills | Status: DC | PRN

## 2018-11-04 NOTE — Telephone Encounter (Signed)
-----   Message from Marti Sleigh, [place] sent at 11/04/2018 12:03 PM EDT ----- Regarding: Rx BP cuff Wants BP prescription sent to pharmacy

## 2018-11-04 NOTE — Telephone Encounter (Signed)
Blood pressure cuff Rx sent to pharmacy per patient request.  Clovis Pu, RN

## 2018-11-05 ENCOUNTER — Other Ambulatory Visit (INDEPENDENT_AMBULATORY_CARE_PROVIDER_SITE_OTHER): Payer: 59 | Admitting: *Deleted

## 2018-11-05 ENCOUNTER — Other Ambulatory Visit: Payer: Self-pay

## 2018-11-05 DIAGNOSIS — Z3480 Encounter for supervision of other normal pregnancy, unspecified trimester: Secondary | ICD-10-CM

## 2018-11-05 DIAGNOSIS — Z23 Encounter for immunization: Secondary | ICD-10-CM

## 2018-11-05 NOTE — Progress Notes (Signed)
   Patient in clinic for labs only.  Clovis Pu, RN

## 2018-11-06 LAB — CBC
Hematocrit: 31.6 % — ABNORMAL LOW (ref 34.0–46.6)
Hemoglobin: 10 g/dL — ABNORMAL LOW (ref 11.1–15.9)
MCH: 27.8 pg (ref 26.6–33.0)
MCHC: 31.6 g/dL (ref 31.5–35.7)
MCV: 88 fL (ref 79–97)
Platelets: 267 10*3/uL (ref 150–450)
RBC: 3.6 x10E6/uL — ABNORMAL LOW (ref 3.77–5.28)
RDW: 12.6 % (ref 11.7–15.4)
WBC: 8.7 10*3/uL (ref 3.4–10.8)

## 2018-11-06 LAB — GLUCOSE TOLERANCE, 2 HOURS W/ 1HR
Glucose, 1 hour: 172 mg/dL (ref 65–179)
Glucose, 2 hour: 115 mg/dL (ref 65–152)
Glucose, Fasting: 81 mg/dL (ref 65–91)

## 2018-11-06 LAB — HIV ANTIBODY (ROUTINE TESTING W REFLEX): HIV Screen 4th Generation wRfx: NONREACTIVE

## 2018-11-06 LAB — RPR: RPR Ser Ql: NONREACTIVE

## 2018-11-13 ENCOUNTER — Other Ambulatory Visit: Payer: Self-pay | Admitting: Family

## 2018-11-13 DIAGNOSIS — O99013 Anemia complicating pregnancy, third trimester: Secondary | ICD-10-CM | POA: Insufficient documentation

## 2018-11-13 MED ORDER — FERROUS SULFATE 325 (65 FE) MG PO TABS: 325.0000 mg | ORAL_TABLET | Freq: Two times a day (BID) | ORAL | 1 refills | Status: DC

## 2018-11-20 ENCOUNTER — Telehealth: Payer: Self-pay | Admitting: *Deleted

## 2018-11-20 DIAGNOSIS — B3731 Acute candidiasis of vulva and vagina: Secondary | ICD-10-CM

## 2018-11-20 DIAGNOSIS — B373 Candidiasis of vulva and vagina: Secondary | ICD-10-CM

## 2018-11-20 MED ORDER — TERCONAZOLE 0.4 % VA CREA: 1.0000 | TOPICAL_CREAM | Freq: Every day | VAGINAL | 0 refills | Status: DC

## 2018-11-20 NOTE — Telephone Encounter (Signed)
Patient called to request medication for yeast infection. Patient is having "cottage cheese" vaginal discharge. Medication sent to pharmacy.  Clovis Pu, RN

## 2018-11-27 ENCOUNTER — Other Ambulatory Visit: Payer: Self-pay

## 2018-11-27 ENCOUNTER — Encounter: Payer: Self-pay | Admitting: Obstetrics and Gynecology

## 2018-11-27 ENCOUNTER — Ambulatory Visit (INDEPENDENT_AMBULATORY_CARE_PROVIDER_SITE_OTHER): Payer: 59 | Admitting: Obstetrics and Gynecology

## 2018-11-27 VITALS — BP 122/70 | Wt 212.0 lb

## 2018-11-27 DIAGNOSIS — Z3480 Encounter for supervision of other normal pregnancy, unspecified trimester: Secondary | ICD-10-CM

## 2018-11-27 DIAGNOSIS — Z3A31 31 weeks gestation of pregnancy: Secondary | ICD-10-CM

## 2018-11-27 DIAGNOSIS — Z3483 Encounter for supervision of other normal pregnancy, third trimester: Secondary | ICD-10-CM

## 2018-11-27 NOTE — Progress Notes (Signed)
   TELEHEALTH VIRTUAL OBSTETRICS VISIT ENCOUNTER NOTE  I connected with Becky Cochran on 11/27/18 at  8:30 AM EDT by Webex at home and verified that I am speaking with the correct person using two identifiers.   I discussed the limitations, risks, security and privacy concerns of performing an evaluation and management service by telephone and the availability of in person appointments. I also discussed with the patient that there may be a patient responsible charge related to this service. The patient expressed understanding and agreed to proceed.  Subjective:  Becky Cochran is a 28 y.o. [phone] at [redacted]w[redacted]d being followed for ongoing prenatal care.  She is currently monitored for the following issues for this low-risk pregnancy and has Supervision of other normal pregnancy, antepartum and Anemia affecting pregnancy in third trimester on their problem list.  Patient reports no complaints. She is considering a waterbirth, if it is re-instated by the time she delivers. She expresses that she had "bad birth experiences in the past; where she was made to lie on her back the entire time with back labor." Reports fetal movement. Denies any contractions, bleeding or leaking of fluid.   The following portions of the patient's history were reviewed and updated as appropriate: allergies, current medications, past family history, past medical history, past social history, past surgical history and problem list.   Objective:   General:  Alert, oriented and cooperative.   Mental Status: Normal mood and affect perceived. Normal judgment and thought content.  Rest of physical exam deferred due to type of encounter  Assessment and Plan:  Pregnancy: [id number] at [redacted]w[redacted]d Supervision of other normal pregnancy, antepartum - Will send waterbirth class instructor contact information to patient to get online class information  - Information provided on waterbirth & FKC - Discussed other labor pain management options that  could be utilized if waterbirth is not re-instated by the time she delivers. - Plans to get Depo for 6 months and then switch to NuvaRing   Preterm labor symptoms and general obstetric precautions including but not limited to vaginal bleeding, contractions, leaking of fluid and fetal movement were reviewed in detail with the patient.  I discussed the assessment and treatment plan with the patient. The patient was provided an opportunity to ask questions and all were answered. The patient agreed with the plan and demonstrated an understanding of the instructions. The patient was advised to call back or seek an in-person office evaluation/go to MAU at [facility] for any urgent or concerning symptoms. Please refer to After Visit Summary for other counseling recommendations.   I provided 10 minutes of non-face-to-face time during this encounter. There was 5 minutes of chart review time spent prior to this encounter. Total time spent= 15 minutes.  Return in about 4 weeks (around 12/25/2018) for Return OB w/GBS.  Future Appointments  Date Time Provider Department Center  12/25/2018  9:20 AM Sharyon Cable, CNM [facility] None  01/01/2019  9:10 AM Raelyn Mora, CNM [facility] None  01/08/2019  8:40 AM Raelyn Mora, CNM [facility] None  01/15/2019  8:50 AM Raelyn Mora, CNM [facility] None    Raelyn Mora, CNM [facility] [employer], [facility]

## 2018-11-27 NOTE — Patient Instructions (Addendum)
Considering Waterbirth? Guide for patients at [facility] [employer]  Why consider waterbirth?  . Gentle birth for babies . Less pain medicine used in labor . May allow for passive descent/less pushing . May reduce perineal tears  . More mobility and instinctive maternal position changes . Increased maternal relaxation . Reduced blood pressure in labor  Is waterbirth safe? What are the risks of infection, drowning or other complications?  . Infection: o Very low risk (3.7 % for tub vs 4.8% for bed) o 7 in 8000 waterbirths with documented infection o Poorly cleaned equipment most common cause o Slightly lower group B strep transmission rate  . Drowning o Maternal:  - Very low risk   - Related to seizures or fainting o Newborn:  - Very low risk. No evidence of increased risk of respiratory problems in multiple large studies - Physiological protection from breathing under water - Avoid underwater birth if there are any fetal complications - Once baby's head is out of the water, keep it out.  . Birth complication o Some reports of cord trauma, but risk decreased by bringing baby to surface gradually o No evidence of increased risk of shoulder dystocia. Mothers can usually change positions faster in water than in a bed, possibly aiding the maneuvers to free the shoulder.   You must attend a Linden Dolin class at [facility] - Classes offered online during COVID-19  3rd Wednesday of every month from [employer] by calling [phone] or online at [email or web address]  Bring [place] the certificate from the class to your prenatal appointment  Meet with a midwife at 36 weeks to see if you can still plan a waterbirth and to sign the consent.   If you plan a waterbirth after September 22, 2018, at [facility] and [facility], you will need to purchase the following:  Fish net  Bathing suit top (optional)  Long-handled mirror  (optional)  If you plan a waterbirth before September 22, 2018: Purchase or rent the following supplies: You are responsible for providing all supplies listed above. **If you do not have all necessary supplies you cannot have a waterbirth.**   Fish net  Bathing suit top (optional)  Long-handled mirror (optional)   Things that would prevent you from having a waterbirth:  Premature, <37wks  Previous cesarean birth  Presence of thick meconium-stained fluid  Multiple gestation (Twins, triplets, etc.)  Uncontrolled diabetes or gestational diabetes requiring medication  Hypertension requiring medication or diagnosis of pre-eclampsia  Heavy vaginal bleeding  Non-reassuring fetal heart rate  Active infection (MRSA, etc.). Group B Strep is NOT a contraindication for waterbirth.  If your labor has to be induced and induction method requires continuous monitoring of the baby's heart rate  Other risks/issues identified by your obstetrical provider  Please remember that birth is unpredictable. Under certain unforeseeable circumstances your provider may advise against giving birth in the tub. These decisions will be made on a case-by-case basis and with the safety of you and your baby as our highest priority.    Fetal Movement Counts Patient Name: ________________________________________________ Patient Due Date: ____________________ What is a fetal movement count?  A fetal movement count is the number of times that you feel your baby move during a certain amount of time. This may also be called a fetal kick count. A fetal movement count is recommended for every pregnant woman. You may be asked to start counting fetal movements as early as week 28 of your  pregnancy. Pay attention to when your baby is most active. You may notice your baby's sleep and wake cycles. You may also notice things that make your baby move more. You should do a fetal movement count:  When your baby is normally  most active.  At the same time each day. A good time to count movements is while you are resting, after having something to eat and drink. How do I count fetal movements? 1. Find a quiet, comfortable area. Sit, or lie down on your side. 2. Write down the date, the start time and stop time, and the number of movements that you felt between those two times. Take this information with you to your health care visits. 3. For 2 hours, count kicks, flutters, swishes, rolls, and jabs. You should feel at least 10 movements during 2 hours. 4. You may stop counting after you have felt 10 movements. 5. If you do not feel 10 movements in 2 hours, have something to eat and drink. Then, keep resting and counting for 1 hour. If you feel at least 4 movements during that hour, you may stop counting. Contact a health care provider if:  You feel fewer than 4 movements in 2 hours.  Your baby is not moving like he or she usually does. Date: ____________ Start time: ____________ Stop time: ____________ Movements: ____________ Date: ____________ Start time: ____________ Stop time: ____________ Movements: ____________ Date: ____________ Start time: ____________ Stop time: ____________ Movements: ____________ Date: ____________ Start time: ____________ Stop time: ____________ Movements: ____________ Date: ____________ Start time: ____________ Stop time: ____________ Movements: ____________ Date: ____________ Start time: ____________ Stop time: ____________ Movements: ____________ Date: ____________ Start time: ____________ Stop time: ____________ Movements: ____________ Date: ____________ Start time: ____________ Stop time: ____________ Movements: ____________ Date: ____________ Start time: ____________ Stop time: ____________ Movements: ____________ This information is not intended to replace advice given to you by your health care provider. Make sure you discuss any questions you have with your health care provider.  Document Released: 08/16/2006 Document Revised: 03/15/2016 Document Reviewed: 08/26/2015 Elsevier Interactive Patient Education  2019 [employer]

## 2018-11-29 ENCOUNTER — Encounter: Payer: 59 | Admitting: Family

## 2018-12-04 ENCOUNTER — Ambulatory Visit (INDEPENDENT_AMBULATORY_CARE_PROVIDER_SITE_OTHER): Payer: 59 | Admitting: *Deleted

## 2018-12-04 ENCOUNTER — Other Ambulatory Visit (HOSPITAL_COMMUNITY)
Admission: RE | Admit: 2018-12-04 | Discharge: 2018-12-04 | Disposition: A | Discharge status: Home / Self Care | Payer: 59 | Source: Ambulatory Visit | Attending: Obstetrics and Gynecology | Admitting: Obstetrics and Gynecology

## 2018-12-04 ENCOUNTER — Other Ambulatory Visit: Payer: Self-pay

## 2018-12-04 VITALS — BP 131/84 | HR 94 | Temp 98.1°F | Wt 217.0 lb

## 2018-12-04 DIAGNOSIS — O26899 Other specified pregnancy related conditions, unspecified trimester: Secondary | ICD-10-CM | POA: Diagnosis not present

## 2018-12-04 DIAGNOSIS — N898 Other specified noninflammatory disorders of vagina: Secondary | ICD-10-CM | POA: Insufficient documentation

## 2018-12-04 NOTE — Progress Notes (Signed)
   SUBJECTIVE:  28 y.o. female complains of vaginal odor and green and at times cottage cheese vaginal discharge for ongoing. Denies abnormal vaginal bleeding or significant pelvic pain or fever. No UTI symptoms. Denies history of known exposure to STD.  Patient's last menstrual period was 04/20/2018 (exact date).  OBJECTIVE:  She appears well, afebrile. Urine dipstick: not done.  ASSESSMENT:  Vaginal Discharge  Vaginal Odor   PLAN:  GC, chlamydia, trichomonas, BVAG, CVAG probe sent to lab. Treatment: To be determined once lab results are received ROV prn if symptoms persist or worsen.   Clovis Pu, RN

## 2018-12-05 LAB — CERVICOVAGINAL ANCILLARY ONLY
Bacterial vaginitis: POSITIVE — AB
Candida vaginitis: POSITIVE — AB
Chlamydia: NEGATIVE
Neisseria Gonorrhea: NEGATIVE
Trichomonas: NEGATIVE

## 2018-12-06 ENCOUNTER — Telehealth: Payer: Self-pay | Admitting: *Deleted

## 2018-12-06 DIAGNOSIS — B373 Candidiasis of vulva and vagina: Secondary | ICD-10-CM

## 2018-12-06 DIAGNOSIS — B3731 Acute candidiasis of vulva and vagina: Secondary | ICD-10-CM

## 2018-12-06 DIAGNOSIS — B9689 Other specified bacterial agents as the cause of diseases classified elsewhere: Secondary | ICD-10-CM

## 2018-12-06 MED ORDER — TERCONAZOLE 0.4 % VA CREA: 1.0000 | TOPICAL_CREAM | Freq: Every day | VAGINAL | 0 refills | Status: DC

## 2018-12-06 MED ORDER — METRONIDAZOLE 500 MG PO TABS: 500.0000 mg | ORAL_TABLET | Freq: Two times a day (BID) | ORAL | 0 refills | Status: DC

## 2018-12-06 NOTE — Telephone Encounter (Signed)
-----   Message from Raelyn Mora, [place] sent at 12/05/2018 10:13 PM EDT ----- Please Rx for BV then yeast. Advise patient to take Flagyl first then follow with Terazol after Flagyl is completed.

## 2018-12-25 ENCOUNTER — Encounter: Payer: Self-pay | Admitting: Certified Nurse Midwife

## 2018-12-25 ENCOUNTER — Ambulatory Visit (INDEPENDENT_AMBULATORY_CARE_PROVIDER_SITE_OTHER): Payer: 59 | Admitting: Certified Nurse Midwife

## 2018-12-25 ENCOUNTER — Other Ambulatory Visit (HOSPITAL_COMMUNITY)
Admission: RE | Admit: 2018-12-25 | Discharge: 2018-12-25 | Disposition: A | Discharge status: Home / Self Care | Payer: 59 | Source: Ambulatory Visit | Attending: Certified Nurse Midwife | Admitting: Certified Nurse Midwife

## 2018-12-25 ENCOUNTER — Other Ambulatory Visit: Payer: Self-pay

## 2018-12-25 VITALS — BP 126/82 | HR 82 | Temp 98.3°F | Wt 222.4 lb

## 2018-12-25 DIAGNOSIS — Z3483 Encounter for supervision of other normal pregnancy, third trimester: Secondary | ICD-10-CM

## 2018-12-25 DIAGNOSIS — Z3480 Encounter for supervision of other normal pregnancy, unspecified trimester: Secondary | ICD-10-CM | POA: Diagnosis not present

## 2018-12-25 DIAGNOSIS — Z3A35 35 weeks gestation of pregnancy: Secondary | ICD-10-CM

## 2018-12-25 NOTE — Patient Instructions (Signed)
Reasons to go to MAU:  1.  Contractions are  5 minutes apart or less, each last 1 minute, these have been going on for 1-2 hours, and you cannot walk or talk during them 2.  You have a large gush of fluid, or a trickle of fluid that will not stop and you have to wear a pad 3.  You have bleeding that is bright red, heavier than spotting--like menstrual bleeding (spotting can be normal in early labor or after a check of your cervix) 4.  You do not feel the baby moving like he/she normally does  

## 2018-12-25 NOTE — Progress Notes (Signed)
   PRENATAL VISIT NOTE  Subjective:  Becky Cochran is a 28 y.o. [phone] at [redacted]w[redacted]d being seen today for ongoing prenatal care.  She is currently monitored for the following issues for this low-risk pregnancy and has Supervision of other normal pregnancy, antepartum and Anemia affecting pregnancy in third trimester on their problem list.  Patient reports occasional contractions- Braxton Hicks contractions.  Contractions: Irregular. Vag. Bleeding: None.  Movement: Present. Denies leaking of fluid.   The following portions of the patient's history were reviewed and updated as appropriate: allergies, current medications, past family history, past medical history, past social history, past surgical history and problem list.   Objective:   Vitals:   12/25/18 0934  BP: 126/82  Pulse: 82  Temp: 98.3 F (36.8 C)  Weight: 222 lb 6.4 oz (100.9 kg)    Fetal Status: Fetal Heart Rate (bpm): 160 Fundal Height: 35 cm Movement: Present  Presentation: Vertex  General:  Alert, oriented and cooperative. Patient is in no acute distress.  Skin: Skin is warm and dry. No rash noted.   Cardiovascular: Normal heart rate noted  Respiratory: Normal respiratory effort, no problems with respiration noted  Abdomen: Soft, gravid, appropriate for gestational age.  Pain/Pressure: Present     Pelvic: Cervical exam performed Dilation: 1 Effacement (%): Thick Station: -3  Extremities: Normal range of motion.  Edema: None  Mental Status: Normal mood and affect. Normal behavior. Normal judgment and thought content.   Assessment and Plan:  Pregnancy: [id number] at [redacted]w[redacted]d 1. Supervision of other normal pregnancy, antepartum - Patient doing well, no complaints  - Anticipatory guidance on upcoming appointments with next ones being MyChart appointments  - Routine prenatal care  - Labor precautions, reasons to present to MAU and fetal kick counts discussed  - COVID 19 precautions discussed  - Patient completing medication for  yeast infection currently, has 3 days left  - Culture, beta strep (group b only) - Cervicovaginal ancillary only( [place])  Preterm labor symptoms and general obstetric precautions including but not limited to vaginal bleeding, contractions, leaking of fluid and fetal movement were reviewed in detail with the patient. Please refer to After Visit Summary for other counseling recommendations.   Return in about 1 week (around 01/01/2019) for ROB-MyChart visit.  Future Appointments  Date Time Provider Department Center  01/01/2019  9:10 AM Raelyn Mora, CNM [facility] None  01/08/2019  8:40 AM Raelyn Mora, CNM [facility] None  01/15/2019  8:50 AM Raelyn Mora, CNM [facility] None  01/22/2019  8:50 AM Raelyn Mora, CNM [facility] None    Sharyon Cable, CNM

## 2018-12-27 ENCOUNTER — Encounter: Payer: 59 | Admitting: Family

## 2018-12-28 LAB — CULTURE, BETA STREP (GROUP B ONLY): Strep Gp B Culture: POSITIVE — AB

## 2018-12-30 LAB — CERVICOVAGINAL ANCILLARY ONLY
Chlamydia: NEGATIVE
Neisseria Gonorrhea: NEGATIVE

## 2019-01-01 ENCOUNTER — Encounter: Payer: Self-pay | Admitting: Obstetrics and Gynecology

## 2019-01-01 ENCOUNTER — Telehealth (INDEPENDENT_AMBULATORY_CARE_PROVIDER_SITE_OTHER): Payer: 59 | Admitting: Obstetrics and Gynecology

## 2019-01-01 ENCOUNTER — Other Ambulatory Visit: Payer: Self-pay

## 2019-01-01 VITALS — BP 126/68

## 2019-01-01 DIAGNOSIS — Z3A36 36 weeks gestation of pregnancy: Secondary | ICD-10-CM

## 2019-01-01 DIAGNOSIS — O2343 Unspecified infection of urinary tract in pregnancy, third trimester: Secondary | ICD-10-CM

## 2019-01-01 DIAGNOSIS — B373 Candidiasis of vulva and vagina: Secondary | ICD-10-CM

## 2019-01-01 DIAGNOSIS — B3731 Acute candidiasis of vulva and vagina: Secondary | ICD-10-CM

## 2019-01-01 DIAGNOSIS — B951 Streptococcus, group B, as the cause of diseases classified elsewhere: Secondary | ICD-10-CM

## 2019-01-01 DIAGNOSIS — Z3480 Encounter for supervision of other normal pregnancy, unspecified trimester: Secondary | ICD-10-CM

## 2019-01-01 DIAGNOSIS — Z3483 Encounter for supervision of other normal pregnancy, third trimester: Secondary | ICD-10-CM

## 2019-01-01 NOTE — Progress Notes (Signed)
   TELEHEALTH VIRTUAL OBSTETRICS VISIT ENCOUNTER NOTE  I connected with Becky Cochran on 01/01/19 at  9:10 AM EDT by telephone at home and verified that I am speaking with the correct person using two identifiers.   I discussed the limitations, risks, security and privacy concerns of performing an evaluation and management service by telephone and the availability of in person appointments. I also discussed with the patient that there may be a patient responsible charge related to this service. The patient expressed understanding and agreed to proceed.  Subjective:  Becky Cochran is a 28 y.o. [phone] at [redacted]w[redacted]d being followed for ongoing prenatal care.  She is currently monitored for the following issues for this low-risk pregnancy and has Supervision of other normal pregnancy, antepartum and Anemia affecting pregnancy in third trimester on their problem list.  Patient reports "thick cottage-cheese like vaginal discharge that I can't get rid of". She has taken Terazol cream 3 times without resolution or relief from sx's Reports fetal movement. Denies any contractions, bleeding or leaking of fluid.   The following portions of the patient's history were reviewed and updated as appropriate: allergies, current medications, past family history, past medical history, past social history, past surgical history and problem list.   Objective:   General:  Alert, oriented and cooperative.   Mental Status: Normal mood and affect perceived. Normal judgment and thought content.  Rest of physical exam deferred due to type of encounter  Assessment and Plan:  Pregnancy: [id number] at [redacted]w[redacted]d Supervision of other normal pregnancy, antepartum  Vaginal yeast infection - Information provided on alternative vaginal treatments for yeast infection  Group B Streptococcus urinary tract infection affecting pregnancy in third trimester - Anticipatory guidance for getting abx in labor and requiring a minimum of 2 doses to  be adequately covered - Discussed effects on newborn if she does not receive proper abx tx  Preterm labor symptoms and general obstetric precautions including but not limited to vaginal bleeding, contractions, leaking of fluid and fetal movement were reviewed in detail with the patient.  I discussed the assessment and treatment plan with the patient. The patient was provided an opportunity to ask questions and all were answered. The patient agreed with the plan and demonstrated an understanding of the instructions. The patient was advised to call back or seek an in-person office evaluation/go to MAU at [facility] for any urgent or concerning symptoms. Please refer to After Visit Summary for other counseling recommendations.   I provided 10 minutes of non-face-to-face time during this encounter. There was 5 minutes of chart review time spent prior to this encounter. Total time spent = 15 minutes.   Return in about 1 week (around 01/08/2019) for Return OB - My Chart video.  Future Appointments  Date Time Provider Department Center  01/08/2019  8:40 AM Raelyn Mora, CNM [facility] None  01/15/2019  8:50 AM Raelyn Mora, CNM [facility] None  01/22/2019  8:50 AM Raelyn Mora, CNM [facility] None    Raelyn Mora, CNM [facility] [employer], [facility]

## 2019-01-01 NOTE — Patient Instructions (Signed)
Alternative Vaginitis Therapies  1) soak in tub of warm water waist high with 1/2 cup of baking soda in water for ~ 20 mins. 2) soak 3 tampons in 1 tablespoon of fractionated (liquid form) coconut oil with 10 drops of Melaleuca (Tea Tree) essential oil, insert 1 saturated tampon vaginally at bedtime x 3 days. Both options are to be done after sexual intercourse, menses and when suspects BV and/or yeast infection. Advised that these alternatives will not replace the need to be evaluated, if sx's persist. You will need to seek care at an OB/GYN provider.  GO WHITE: Soap: UNSCENTED Dove (white box light green writing) Laundry detergent (underwear)- Dreft or [employer] unscented WHITE 100% cotton panties (NOT just cotton crouch) Sanitary napkin/panty liners: UNSCENTED.  If it doesn't SAY unscented it can have a scent/perfume    NO PERFUMES OR LOTIONS OR POTIONS in the vulvar area (may use regular [place]) Condoms: hypoallergenic only. Non dyed (no color) Toilet papers: white only Wash clothes: use a separate wash cloth. WHITE.  Wash in Dreft.   You can purchase Tea Tree Oil locally at:  [employer] [address] [place], [place] [address] [phone]   [employer] [address] [place], [place] [address] [phone]  

## 2019-01-08 ENCOUNTER — Encounter: Payer: Self-pay | Admitting: Obstetrics and Gynecology

## 2019-01-08 ENCOUNTER — Telehealth (INDEPENDENT_AMBULATORY_CARE_PROVIDER_SITE_OTHER): Payer: 59 | Admitting: Obstetrics and Gynecology

## 2019-01-08 ENCOUNTER — Other Ambulatory Visit: Payer: Self-pay

## 2019-01-08 DIAGNOSIS — Z3A37 37 weeks gestation of pregnancy: Secondary | ICD-10-CM

## 2019-01-08 DIAGNOSIS — Z3480 Encounter for supervision of other normal pregnancy, unspecified trimester: Secondary | ICD-10-CM

## 2019-01-08 DIAGNOSIS — Z3483 Encounter for supervision of other normal pregnancy, third trimester: Secondary | ICD-10-CM

## 2019-01-08 NOTE — Progress Notes (Addendum)
   MY CHART VIDEO VIRTUAL OBSTETRICS VISIT ENCOUNTER NOTE  I connected with Selinda Eon on 01/08/19 at  8:40 AM EDT by My Chart video at home and verified that I am speaking with the correct person using two identifiers.   I discussed the limitations, risks, security and privacy concerns of performing an evaluation and management service by My Chart video and the availability of in person appointments. I also discussed with the patient that there may be a patient responsible charge related to this service. The patient expressed understanding and agreed to proceed.  Subjective:  Becky Cochran is a 28 y.o. [phone] at [redacted]w[redacted]d being followed for ongoing prenatal care.  She is currently monitored for the following issues for this low-risk pregnancy and has Supervision of other normal pregnancy, antepartum and Anemia affecting pregnancy in third trimester on their problem list.  Patient reports occasional contractions. She reports the alternative vaginal therapies have resolved er vaginal irritation. She reports feeling FM, but it "seems slower than normal." Reports fetal movement. Denies any contractions, bleeding or leaking of fluid.   The following portions of the patient's history were reviewed and updated as appropriate: allergies, current medications, past family history, past medical history, past social history, past surgical history and problem list.   Objective:   General:  Alert, oriented and cooperative.   Mental Status: Normal mood and affect perceived. Normal judgment and thought content.  Rest of physical exam deferred due to type of encounter  **Patient states she "just woke up and will take BP later and send by My Chart message." Assessment and Plan:  Pregnancy: [id number] at [redacted]w[redacted]d Supervision of other normal pregnancy, antepartum - Information provided on [place] - Advised to go to MAU for prolonged DFM   Term labor symptoms and general obstetric precautions including but not limited  to vaginal bleeding, contractions, leaking of fluid and fetal movement were reviewed in detail with the patient.  I discussed the assessment and treatment plan with the patient. The patient was provided an opportunity to ask questions and all were answered. The patient agreed with the plan and demonstrated an understanding of the instructions. The patient was advised to call back or seek an in-person office evaluation/go to MAU at [facility] for any urgent or concerning symptoms. Please refer to After Visit Summary for other counseling recommendations.   I provided 5 minutes of non-face-to-face time during this encounter. There was 5 minutes of chart review time spent prior to this encounter. Total time spent = 10 minutes.   Return in about 1 week (around 01/15/2019) for Return OB - My Chart video.  Future Appointments  Date Time Provider [place]  01/15/2019  8:50 AM Laury Deep, CNM [facility] None  01/22/2019  8:50 AM Laury Deep, CNM [facility] None    Laury Deep, [place] for [employer], [place]

## 2019-01-08 NOTE — Patient Instructions (Signed)

## 2019-01-15 ENCOUNTER — Other Ambulatory Visit: Payer: Self-pay

## 2019-01-15 ENCOUNTER — Telehealth: Payer: 59 | Admitting: Obstetrics and Gynecology

## 2019-01-15 ENCOUNTER — Encounter (HOSPITAL_COMMUNITY): Payer: Self-pay

## 2019-01-15 ENCOUNTER — Inpatient Hospital Stay (HOSPITAL_COMMUNITY)
Admission: AD | Admit: 2019-01-15 | Discharge: 2019-01-15 | Disposition: A | Discharge status: Home / Self Care | Payer: 59 | Attending: Family Medicine | Admitting: Family Medicine

## 2019-01-15 DIAGNOSIS — Z3A38 38 weeks gestation of pregnancy: Secondary | ICD-10-CM | POA: Insufficient documentation

## 2019-01-15 DIAGNOSIS — O26893 Other specified pregnancy related conditions, third trimester: Secondary | ICD-10-CM | POA: Diagnosis not present

## 2019-01-15 DIAGNOSIS — Z79899 Other long term (current) drug therapy: Secondary | ICD-10-CM | POA: Insufficient documentation

## 2019-01-15 DIAGNOSIS — Z833 Family history of diabetes mellitus: Secondary | ICD-10-CM | POA: Diagnosis not present

## 2019-01-15 DIAGNOSIS — N930 Postcoital and contact bleeding: Secondary | ICD-10-CM | POA: Insufficient documentation

## 2019-01-15 DIAGNOSIS — Z792 Long term (current) use of antibiotics: Secondary | ICD-10-CM | POA: Diagnosis not present

## 2019-01-15 DIAGNOSIS — Z809 Family history of malignant neoplasm, unspecified: Secondary | ICD-10-CM | POA: Insufficient documentation

## 2019-01-15 DIAGNOSIS — N898 Other specified noninflammatory disorders of vagina: Secondary | ICD-10-CM | POA: Diagnosis not present

## 2019-01-15 DIAGNOSIS — O9989 Other specified diseases and conditions complicating pregnancy, childbirth and the puerperium: Secondary | ICD-10-CM | POA: Diagnosis not present

## 2019-01-15 LAB — POCT FERN TEST: POCT Fern Test: NEGATIVE

## 2019-01-15 NOTE — MAU Note (Signed)
Was having intercourse and noticed bright red bleeding then felt clots coming out and it was dripping out.  Started having contractions afterwards-hasn't timed them.  + FM.  No complications.

## 2019-01-15 NOTE — MAU Provider Note (Signed)
Chief Complaint:  Vaginal Bleeding   First Provider Initiated Contact with Patient 01/15/19 0045      HPI: Becky Cochran is a 28 y.o. [id number] at 77w4dwho presents to maternity admissions reporting vaginal bleeding after intercourse.  States it was a lot of watery fluid with blood and clots.  Less heavy now. She reports good fetal movement, denies vaginal itching/burning, urinary symptoms, h/a, dizziness, n/v, diarrhea, constipation or fever/chills. .  RN Note: Was having intercourse and noticed bright red bleeding then felt clots coming out and it was dripping out.  Started having contractions afterwards-hasn't timed them.  + FM.  No complications.  Past Medical History: Past Medical History:  Diagnosis Date  . Medical history non-contributory     Past obstetric history: OB History  Gravida Para Term Preterm AB Living  4 2 2   1 2   SAB TAB Ectopic Multiple Live Births          2    # Outcome Date GA Lbr Len/2nd Weight Sex Delivery Anes PTL Lv  4 Current           3 AB 10/2016          2 Term 12/14/13 [redacted]w[redacted]d  3657 g M Vag-Spont EPI N LIV  1 Term 06/25/12 [redacted]w[redacted]d  3402 g F Vag-Spont EPI N LIV    Past Surgical History: Past Surgical History:  Procedure Laterality Date  . NO PAST SURGERIES      Family History: Family History  Problem Relation Age of Onset  . Diabetes Mother   . Heart disease Mother   . Kidney disease Mother   . Hypertension Mother   . Cancer Maternal Grandmother   . Cancer Paternal Grandmother     Social History: Social History   Tobacco Use  . Smoking status: Never Smoker  . Smokeless tobacco: Never Used  Substance Use Topics  . Alcohol use: Not Currently    Comment: socially  . Drug use: Never    Allergies: No Known Allergies  Meds:  Medications Prior to Admission  Medication Sig Dispense Refill Last Dose  . Blood Pressure Monitoring (BLOOD PRESSURE MONITOR/M CUFF) MISC 1 each by Does not apply route as needed. 1 each 0   . ferrous sulfate  (FERROUSUL) 325 (65 FE) MG tablet Take 1 tablet (325 mg total) by mouth 2 (two) times daily. 60 tablet 1   . metroNIDAZOLE (FLAGYL) 500 MG tablet Take 1 tablet (500 mg total) by mouth 2 (two) times daily. 14 tablet 0   . Prenatal Vit-Fe Phos-FA-Omega (VITAFOL GUMMIES) 3.33-0.333-34.8 MG CHEW Chew 3 each by mouth daily. 90 tablet 12   . terconazole (TERAZOL 7) 0.4 % vaginal cream Place 1 applicator vaginally at bedtime. 45 g 0     I have reviewed patient's Past Medical Hx, Surgical Hx, Family Hx, Social Hx, medications and allergies.   ROS:  Review of Systems  Constitutional: Negative for chills and fever.  Respiratory: Negative for shortness of breath.   Gastrointestinal: Negative for abdominal pain, constipation, diarrhea and nausea.  Genitourinary: Positive for vaginal bleeding and vaginal discharge. Negative for pelvic pain.   Other systems negative  Physical Exam   Patient Vitals for the past 24 hrs:  BP Temp Pulse Resp SpO2 Weight  01/15/19 0052 134/90 - 95 - - -  01/15/19 0035 135/82 98.4 F (36.9 C) 87 19 98 % 102.2 kg   Constitutional: Well-developed, well-nourished female in no acute distress.  Cardiovascular: normal rate and rhythm Respiratory:  normal effort, clear to auscultation bilaterally GI: Abd soft, non-tender, gravid appropriate for gestational age.   No rebound or guarding. MS: Extremities nontender, no edema, normal ROM Neurologic: Alert and oriented x 4.  GU: Neg CVAT.  PELVIC EXAM: Moderate light red thin discharge in vault.  No ferning. Sperm seen.   Vaginal walls and external genitalia normal   Cervix 1-2/long/Ballotable   FHT:  Baseline 140 , moderate variability, accelerations present, no decelerations Contractions:  Irregular  Rare   Labs: No results found for this or any previous visit (from the past 24 hour(s)). A/Positive/-- (12/09 1005)  Imaging:  No results found.  MAU Course/MDM: NST reviewed and found to be reactive.  Occasional  contractions  Treatments in MAU included EFM.    Assessment: Single intrauterine pregnancy at 5560w4d Postcoital bleeding Reassuring fetal heart rate tracing  Plan: Discharge home Labor precautions and fetal kick counts Bleeding precautions Pelvic rest for a few days Follow up in Office for prenatal visits and recheck  Encouraged to return here or to other Urgent Care/ED if she develops worsening of symptoms, increase in pain, fever, or other concerning symptoms.   Pt stable at time of discharge.  Wynelle BourgeoisMarie Anneka Studer CNM, MSN Certified Nurse-Midwife 01/15/2019 12:56 AM

## 2019-01-15 NOTE — ED Notes (Signed)
Contacted [facility]U and spoke with Janett Billow about sending this patient over to them. Janett Billow advised it was OK to send the patient to them.

## 2019-01-15 NOTE — Discharge Instructions (Signed)
Vaginal Delivery  Vaginal delivery means that you give birth by pushing your baby out of your birth canal (vagina). A team of health care providers will help you before, during, and after vaginal delivery. Birth experiences are unique for every woman and every pregnancy, and birth experiences vary depending on where you choose to give birth. What happens when I arrive at the birth center or hospital? Once you are in labor and have been admitted into the hospital or birth center, your health care provider may:  Review your pregnancy history and any concerns that you have.  Insert an IV into one of your veins. This may be used to give you fluids and medicines.  Check your blood pressure, pulse, temperature, and heart rate (vital signs).  Check whether your bag of water (amniotic sac) has broken (ruptured).  Talk with you about your birth plan and discuss pain control options. Monitoring Your health care provider may monitor your contractions (uterine monitoring) and your baby's heart rate (fetal monitoring). You may need to be monitored:  Often, but not continuously (intermittently).  All the time or for long periods at a time (continuously). Continuous monitoring may be needed if: ? You are taking certain medicines, such as medicine to relieve pain or make your contractions stronger. ? You have pregnancy or labor complications. Monitoring may be done by:  Placing a special stethoscope or a handheld monitoring device on your abdomen to check your baby's heartbeat and to check for contractions.  Placing monitors on your abdomen (external monitors) to record your baby's heartbeat and the frequency and length of contractions.  Placing monitors inside your uterus through your vagina (internal monitors) to record your baby's heartbeat and the frequency, length, and strength of your contractions. Depending on the type of monitor, it may remain in your uterus or on your baby's head until  birth.  Telemetry. This is a type of continuous monitoring that can be done with external or internal monitors. Instead of having to stay in bed, you are able to move around during telemetry. Physical exam Your health care provider may perform frequent physical exams. This may include:  Checking how and where your baby is positioned in your uterus.  Checking your cervix to determine: ? Whether it is thinning out (effacing). ? Whether it is opening up (dilating). What happens during labor and delivery?  Normal labor and delivery is divided into the following three stages: Stage 1  This is the longest stage of labor.  This stage can last for hours or days.  Throughout this stage, you will feel contractions. Contractions generally feel mild, infrequent, and irregular at first. They get stronger, more frequent (about every 2-3 minutes), and more regular as you move through this stage.  This stage ends when your cervix is completely dilated to 4 inches (10 cm) and completely effaced. Stage 2  This stage starts once your cervix is completely effaced and dilated and lasts until the delivery of your baby.  This stage may last from 20 minutes to 2 hours.  This is the stage where you will feel an urge to push your baby out of your vagina.  You may feel stretching and burning pain, especially when the widest part of your baby's head passes through the vaginal opening (crowning).  Once your baby is delivered, the umbilical cord will be clamped and cut. This usually occurs after waiting a period of 1-2 minutes after delivery.  Your baby will be placed on your bare chest (  skin-to-skin contact) in an upright position and covered with a warm blanket. Watch your baby for feeding cues, like rooting or sucking, and help the baby to your breast for his or her first feeding. Stage 3  This stage starts immediately after the birth of your baby and ends after you deliver the placenta.  This stage may  take anywhere from 5 to 30 minutes.  After your baby has been delivered, you will feel contractions as your body expels the placenta and your uterus contracts to control bleeding. What can I expect after labor and delivery?  After labor is over, you and your baby will be monitored closely until you are ready to go home to ensure that you are both healthy. Your health care team will teach you how to care for yourself and your baby.  You and your baby will stay in the same room (rooming in) during your hospital stay. This will encourage early bonding and successful breastfeeding.  You may continue to receive fluids and medicines through an IV.  Your uterus will be checked and massaged regularly (fundal massage).  You will have some soreness and pain in your abdomen, vagina, and the area of skin between your vaginal opening and your anus (perineum).  If an incision was made near your vagina (episiotomy) or if you had some vaginal tearing during delivery, cold compresses may be placed on your episiotomy or your tear. This helps to reduce pain and swelling.  You may be given a squirt bottle to use instead of wiping when you go to the bathroom. To use the squirt bottle, follow these steps: ? Before you urinate, fill the squirt bottle with warm water. Do not use hot water. ? After you urinate, while you are sitting on the toilet, use the squirt bottle to rinse the area around your urethra and vaginal opening. This rinses away any urine and blood. ? Fill the squirt bottle with clean water every time you use the bathroom.  It is normal to have vaginal bleeding after delivery. Wear a sanitary pad for vaginal bleeding and discharge. Summary  Vaginal delivery means that you will give birth by pushing your baby out of your birth canal (vagina).  Your health care provider may monitor your contractions (uterine monitoring) and your baby's heart rate (fetal monitoring).  Your health care provider may  perform a physical exam.  Normal labor and delivery is divided into three stages.  After labor is over, you and your baby will be monitored closely until you are ready to go home. This information is not intended to replace advice given to you by your health care provider. Make sure you discuss any questions you have with your health care provider. Document Released: 04/25/2008 Document Revised: 08/21/2017 Document Reviewed: 08/21/2017 Elsevier Interactive Patient Education  2019 [employer]. Activity Restriction During Pregnancy Your health care provider may recommend specific activity restrictions during pregnancy for a variety of reasons. Activity restriction may require that you limit activities that require great effort, such as exercise, lifting, or sex. The type of activity restriction will vary for each person, depending on your risk or the problems you are having. Activity restriction may be recommended for a period of time until your baby is delivered. Why are activity restrictions recommended? Activity restriction may be recommended if:  Your placenta is partially or completely covering the opening of your cervix (placenta previa).  There is bleeding between the wall of the uterus and the amniotic sac in the first  trimester of pregnancy (subchorionic hemorrhage).  You went into labor too early (preterm labor).  You have a history of miscarriage.  You have a condition that causes high blood pressure during pregnancy (preeclampsia or eclampsia).  You are pregnant with more than one baby.  Your baby is not growing well. What are the risks? The risks depend on your specific restriction. Strict bed rest has the most physical and emotional risks and is no longer routinely recommended. Risks of strict bed rest include:  Loss of muscle conditioning from not moving.  Blood clots.  Social isolation.  Depression.  Loss of income. Talk with your health care team about activity  restriction to decide if it is best for you and your baby. Even if you are having problems during your pregnancy, you may be able to continue with normal levels of activity with careful monitoring by your health care team. Follow these instructions at home: If needed, based on your overall health and the health of your baby, your health care provider will decide which type of activity restriction is right for you. Activity restrictions may include:  Not lifting anything heavier than 10 pounds (4.5 kg).  Avoiding activities that take a lot of physical effort.  No lifting or straining.  Resting in a sitting position or lying down for periods of time during the day. Pelvic rest may be recommended along with activity restrictions. If pelvic rest is recommended, then:  Do not have sex, an orgasm, or use sexual stimulation.  Do not use tampons. Do not douche. Do not put anything into your vagina.  Do not lift anything that is heavier than 10 lb (4.5 kg).  Avoid activities that require a lot of effort.  Avoid any activity in which your pelvic muscles could become strained, such as squatting. Questions to ask your health care provider  Why is my activity being limited?  How will activity restrictions affect my body?  Why is rest helpful for me and my baby?  What activities can I do?  When can I return to normal activities? When should I seek immediate medical care? Seek immediate medical care if you have:  Vaginal bleeding.  Vaginal discharge.  Cramping pain in your lower abdomen.  Regular contractions.  A low, dull backache. Summary  Your health care provider may recommend specific activity restrictions during pregnancy for a variety of reasons.  Activity restriction may require that you limit activities such as exercise, lifting, sex, or any other activity that requires great effort.  Discuss the risks and benefits of activity restriction with your health care team to  decide if it is best for you and your baby.  Contact your health care provider right away if you think you are having contractions, or if you notice vaginal bleeding, discharge, or cramping. This information is not intended to replace advice given to you by your health care provider. Make sure you discuss any questions you have with your health care provider. Document Released: 11/11/2010 Document Revised: 11/06/2017 Document Reviewed: 11/06/2017 Elsevier Interactive Patient Education  2019 [employer]

## 2019-01-22 ENCOUNTER — Other Ambulatory Visit: Payer: Self-pay

## 2019-01-22 ENCOUNTER — Ambulatory Visit (INDEPENDENT_AMBULATORY_CARE_PROVIDER_SITE_OTHER): Payer: 59 | Admitting: Obstetrics and Gynecology

## 2019-01-22 VITALS — BP 137/83 | HR 97 | Temp 98.1°F | Wt 222.0 lb

## 2019-01-22 DIAGNOSIS — Z3480 Encounter for supervision of other normal pregnancy, unspecified trimester: Secondary | ICD-10-CM

## 2019-01-22 DIAGNOSIS — Z3A39 39 weeks gestation of pregnancy: Secondary | ICD-10-CM

## 2019-01-22 DIAGNOSIS — Z3483 Encounter for supervision of other normal pregnancy, third trimester: Secondary | ICD-10-CM

## 2019-01-22 NOTE — Progress Notes (Signed)
   PRENATAL VISIT NOTE  Subjective:  Becky Cochran is a 28 y.o. [phone] at [redacted]w[redacted]d being seen today for ongoing prenatal care.  She is currently monitored for the following issues for this low-risk pregnancy and has Supervision of other normal pregnancy, antepartum and Anemia affecting pregnancy in third trimester on their problem list.  Patient reports occasional contractions and vaginal yeast that won't go away, but is no longer irritating. Was seen in [facility]U for post-coital VB last week. No bleeding since then. Contractions: Irregular. Vag. Bleeding: None.  Movement: Present. Denies leaking of fluid.   The following portions of the patient's history were reviewed and updated as appropriate: allergies, current medications, past family history, past medical history, past social history, past surgical history and problem list.   Objective:   Vitals:   01/22/19 0905  BP: 137/83  Pulse: 97  Temp: 98.1 F (36.7 C)  Weight: 222 lb (100.7 kg)    Fetal Status: Fetal Heart Rate (bpm): 148 Fundal Height: 38 cm Movement: Present  Presentation: Vertex  General:  Alert, oriented and cooperative. Patient is in no acute distress.  Skin: Skin is warm and dry. No rash noted.   Cardiovascular: Normal heart rate noted  Respiratory: Normal respiratory effort, no problems with respiration noted  Abdomen: Soft, gravid, appropriate for gestational age.  Pain/Pressure: Absent     Pelvic: Cervical exam performed Dilation: 1 Effacement (%): Thick Station: Ballotable, -3  Extremities: Normal range of motion.  Edema: None  Mental Status: Normal mood and affect. Normal behavior. Normal judgment and thought content.   Assessment and Plan:  Pregnancy: [id number] at [redacted]w[redacted]d Supervision of other normal pregnancy, antepartum - Await labor - Anticipatory guidance for cervical exam next week - Advised not a candidate for membrane stripping d/t (+) GBS - Consider IOL between 41-42 wks, if not delivered before then  Term  labor symptoms and general obstetric precautions including but not limited to vaginal bleeding, contractions, leaking of fluid and fetal movement were reviewed in detail with the patient. Please refer to After Visit Summary for other counseling recommendations.   Return in about 1 week (around 01/29/2019) for Return OB visit.  Future Appointments  Date Time Provider [place]  01/30/2019  8:50 AM Laury Deep, [place] None    Laury Deep, [place]

## 2019-01-29 ENCOUNTER — Telehealth: Payer: Self-pay | Admitting: *Deleted

## 2019-01-29 ENCOUNTER — Other Ambulatory Visit: Payer: Self-pay

## 2019-01-29 ENCOUNTER — Inpatient Hospital Stay (HOSPITAL_COMMUNITY)
Admission: AD | Admit: 2019-01-29 | Discharge: 2019-01-31 | DRG: 807 | Disposition: A | Discharge status: Home / Self Care | Payer: 59 | Attending: Obstetrics & Gynecology | Admitting: Obstetrics & Gynecology

## 2019-01-29 ENCOUNTER — Encounter (HOSPITAL_COMMUNITY): Payer: Self-pay

## 2019-01-29 DIAGNOSIS — O480 Post-term pregnancy: Secondary | ICD-10-CM | POA: Diagnosis present

## 2019-01-29 DIAGNOSIS — Z1159 Encounter for screening for other viral diseases: Secondary | ICD-10-CM

## 2019-01-29 DIAGNOSIS — O99013 Anemia complicating pregnancy, third trimester: Secondary | ICD-10-CM | POA: Diagnosis present

## 2019-01-29 DIAGNOSIS — Z3A40 40 weeks gestation of pregnancy: Secondary | ICD-10-CM | POA: Diagnosis not present

## 2019-01-29 DIAGNOSIS — O99824 Streptococcus B carrier state complicating childbirth: Secondary | ICD-10-CM | POA: Diagnosis present

## 2019-01-29 DIAGNOSIS — Z3480 Encounter for supervision of other normal pregnancy, unspecified trimester: Secondary | ICD-10-CM

## 2019-01-29 DIAGNOSIS — O288 Other abnormal findings on antenatal screening of mother: Secondary | ICD-10-CM | POA: Diagnosis present

## 2019-01-29 LAB — CBC
HCT: 31 % — ABNORMAL LOW (ref 36.0–46.0)
Hemoglobin: 9.9 g/dL — ABNORMAL LOW (ref 12.0–15.0)
MCH: 27.1 pg (ref 26.0–34.0)
MCHC: 31.9 g/dL (ref 30.0–36.0)
MCV: 84.9 fL (ref 80.0–100.0)
Platelets: 296 10*3/uL (ref 150–400)
RBC: 3.65 MIL/uL — ABNORMAL LOW (ref 3.87–5.11)
RDW: 13.4 % (ref 11.5–15.5)
WBC: 9.5 10*3/uL (ref 4.0–10.5)
nRBC: 0 % (ref 0.0–0.2)

## 2019-01-29 LAB — URINALYSIS, ROUTINE W REFLEX MICROSCOPIC
Bilirubin Urine: NEGATIVE
Glucose, UA: NEGATIVE mg/dL
Hgb urine dipstick: NEGATIVE
Ketones, ur: NEGATIVE mg/dL
Nitrite: NEGATIVE
Protein, ur: NEGATIVE mg/dL
Specific Gravity, Urine: 1.008 (ref 1.005–1.030)
pH: 7 (ref 5.0–8.0)

## 2019-01-29 LAB — COMPREHENSIVE METABOLIC PANEL
ALT: 10 U/L (ref 0–44)
AST: 16 U/L (ref 15–41)
Albumin: 2.6 g/dL — ABNORMAL LOW (ref 3.5–5.0)
Alkaline Phosphatase: 77 U/L (ref 38–126)
Anion gap: 10 (ref 5–15)
BUN: 6 mg/dL (ref 6–20)
CO2: 21 mmol/L — ABNORMAL LOW (ref 22–32)
Calcium: 8.6 mg/dL — ABNORMAL LOW (ref 8.9–10.3)
Chloride: 103 mmol/L (ref 98–111)
Creatinine, Ser: 0.74 mg/dL (ref 0.44–1.00)
GFR calc Af Amer: >60 mL/min (ref >60)
GFR calc non Af Amer: >60 mL/min (ref >60)
Glucose, Bld: 192 mg/dL — ABNORMAL HIGH (ref 70–99)
Potassium: 3.4 mmol/L — ABNORMAL LOW (ref 3.5–5.1)
Sodium: 134 mmol/L — ABNORMAL LOW (ref 135–145)
Total Bilirubin: 0.4 mg/dL (ref 0.3–1.2)
Total Protein: 6 g/dL — ABNORMAL LOW (ref 6.5–8.1)

## 2019-01-29 LAB — SARS CORONAVIRUS 2 BY RT PCR (HOSPITAL ORDER, PERFORMED IN ~~LOC~~ HOSPITAL LAB): SARS Coronavirus 2: NEGATIVE

## 2019-01-29 LAB — PROTEIN / CREATININE RATIO, URINE
Creatinine, Urine: 43.61 mg/dL
Total Protein, Urine: <6 mg/dL

## 2019-01-29 LAB — TYPE AND SCREEN
ABO/RH(D): A POS
Antibody Screen: NEGATIVE

## 2019-01-29 MED ORDER — ONDANSETRON HCL 4 MG/2ML IJ SOLN: 4.0000 mg | Freq: Four times a day (QID) | INTRAMUSCULAR | Status: DC | PRN

## 2019-01-29 MED ORDER — FENTANYL CITRATE (PF) 100 MCG/2ML IJ SOLN
100.0000 ug | INTRAMUSCULAR | Status: DC | PRN
  Administered 2019-01-30 (×2): 100 ug via INTRAVENOUS
  Filled 2019-01-29 (×2): qty 2

## 2019-01-29 MED ORDER — OXYTOCIN BOLUS FROM INFUSION
500.0000 mL | Freq: Once | INTRAVENOUS | Status: AC
  Administered 2019-01-30: 500 mL via INTRAVENOUS

## 2019-01-29 MED ORDER — LACTATED RINGERS IV SOLN
INTRAVENOUS | Status: DC
  Administered 2019-01-29 (×2): via INTRAVENOUS

## 2019-01-29 MED ORDER — LIDOCAINE HCL (PF) 1 % IJ SOLN: 30.0000 mL | INTRAMUSCULAR | Status: DC | PRN

## 2019-01-29 MED ORDER — CYCLOBENZAPRINE HCL 10 MG PO TABS
10.0000 mg | ORAL_TABLET | Freq: Once | ORAL | Status: AC
  Administered 2019-01-29: 16:00:00 10 mg via ORAL
  Filled 2019-01-29: qty 1

## 2019-01-29 MED ORDER — SODIUM CHLORIDE 0.9 % IV SOLN
5.0000 10*6.[IU] | Freq: Once | INTRAVENOUS | Status: AC
  Administered 2019-01-29: 20:00:00 5 10*6.[IU] via INTRAVENOUS
  Filled 2019-01-29: qty 5

## 2019-01-29 MED ORDER — PENICILLIN G 3 MILLION UNITS IVPB - SIMPLE MED
3.0000 10*6.[IU] | INTRAVENOUS | Status: DC
  Administered 2019-01-29: 3 10*6.[IU] via INTRAVENOUS
  Filled 2019-01-29: qty 100

## 2019-01-29 MED ORDER — OXYCODONE-ACETAMINOPHEN 5-325 MG PO TABS: 2.0000 | ORAL_TABLET | ORAL | Status: DC | PRN

## 2019-01-29 MED ORDER — SOD CITRATE-CITRIC ACID 500-334 MG/5ML PO SOLN: 30.0000 mL | ORAL | Status: DC | PRN

## 2019-01-29 MED ORDER — ACETAMINOPHEN 325 MG PO TABS: 650.0000 mg | ORAL_TABLET | ORAL | Status: DC | PRN

## 2019-01-29 MED ORDER — LACTATED RINGERS IV SOLN: 500.0000 mL | INTRAVENOUS | Status: DC | PRN

## 2019-01-29 MED ORDER — FLEET ENEMA 7-19 GM/118ML RE ENEM: 1.0000 | ENEMA | RECTAL | Status: DC | PRN

## 2019-01-29 MED ORDER — OXYCODONE-ACETAMINOPHEN 5-325 MG PO TABS: 1.0000 | ORAL_TABLET | ORAL | Status: DC | PRN

## 2019-01-29 MED ORDER — OXYTOCIN 40 UNITS IN NORMAL SALINE INFUSION - SIMPLE MED
2.5000 [IU]/h | INTRAVENOUS | Status: DC
  Filled 2019-01-29: qty 1000

## 2019-01-29 NOTE — Telephone Encounter (Signed)
Patient called today stating she has had a migraine like headache and seeing spots x 3 days. She took Tylenol yesterday with no relief. Pt reported that the headache kept her up last night. Pt took her BP 130s/80s. Advised patient to go to [facility]U for further evaluation. Pt verbalized understanding. Derl Barrow, RN

## 2019-01-29 NOTE — H&P (Addendum)
LABOR AND DELIVERY ADMISSION HISTORY AND PHYSICAL NOTE  Thurmon FairLeana Diez is a 28 y.o. female [phone] with IUP at 1517w4d by LMP presenting for variable decelerations.  She reports positive fetal movement. She denies leakage of fluid or vaginal bleeding. Endorses RUQ pain and headache but blood pressure is normotensive and pre-E labs normal. History of two prior vaginal deliveries in [place], SOL for both. Reports no issues with labor or delivery. No prior significant prenatal history.   Prenatal History/Complications: PNC at [facility] Pregnancy complications:  - GBS +   Past Medical History: Past Medical History:  Diagnosis Date  . Medical history non-contributory     Past Surgical History: Past Surgical History:  Procedure Laterality Date  . NO PAST SURGERIES      Obstetrical History: OB History    Gravida  4   Para  2   Term  2   Preterm      AB  1   Living  2     SAB      TAB      Ectopic      Multiple      Live Births  2           Social History: Social History   Socioeconomic History  . Marital status: Single    Spouse name: Madison  . Number of children: 2  . Years of education: current [employer]  . Highest education level: Some college, no degree  Occupational History  . Not on file  Social Needs  . Financial resource strain: Not very hard  . Food insecurity    Worry: Never true    Inability: Never true  . Transportation needs    Medical: No    Non-medical: No  Tobacco Use  . Smoking status: Never Smoker  . Smokeless tobacco: Never Used  Substance and Sexual Activity  . Alcohol use: Not Currently    Comment: socially  . Drug use: Never  . Sexual activity: Yes    Birth control/protection: None    Comment: last IC-last pm  Lifestyle  . Physical activity    Days per week: 3 days    Minutes per session: 60 min  . Stress: Only a little  Relationships  . Social connections    Talks on phone: More than three times a week     Gets together: More than three times a week    Attends religious service: 1 to 4 times per year    Active member of club or organization: No    Attends meetings of clubs or organizations: Never    Relationship status: Separated  Other Topics Concern  . Not on file  Social History Narrative  . Not on file    Family History: Family History  Problem Relation Age of Onset  . Diabetes Mother   . Heart disease Mother   . Kidney disease Mother   . Hypertension Mother   . Cancer Maternal Grandmother   . Cancer Paternal Grandmother     Allergies: No Known Allergies  Medications Prior to Admission  Medication Sig Dispense Refill Last Dose  . acetaminophen (TYLENOL) 500 MG tablet Take 500 mg by mouth every 6 (six) hours as needed.   01/28/2019 at Unknown time  . Blood Pressure Monitoring (BLOOD PRESSURE MONITOR/M CUFF) MISC 1 each by Does not apply route as needed. 1 each 0 01/28/2019 at Unknown time  . calcium carbonate (TUMS - DOSED IN MG ELEMENTAL CALCIUM) 500 MG chewable tablet  Chew 1 tablet by mouth daily.   01/28/2019 at Unknown time  . ferrous sulfate (FERROUSUL) 325 (65 FE) MG tablet Take 1 tablet (325 mg total) by mouth 2 (two) times daily. 60 tablet 1 Past Week at Unknown time  . Prenatal Vit-Fe Phos-FA-Omega (VITAFOL GUMMIES) 3.33-0.333-34.8 MG CHEW Chew 3 each by mouth daily. 90 tablet 12 01/28/2019 at Unknown time  . metroNIDAZOLE (FLAGYL) 500 MG tablet Take 1 tablet (500 mg total) by mouth 2 (two) times daily. 14 tablet 0 More than a month at Unknown time  . terconazole (TERAZOL 7) 0.4 % vaginal cream Place 1 applicator vaginally at bedtime. 45 g 0 More than a month at Unknown time     Review of Systems  All systems reviewed and negative except as stated in HPI  Physical Exam Blood pressure 128/68, pulse 90, temperature 98.6 F (37 C), resp. rate 12, height 5\' 6"  (1.676 m), weight 102.8 kg, last menstrual period 04/20/2018, SpO2 98 %. General appearance: alert,  oriented Lungs: normal respiratory effort Heart: regular rate Abdomen: soft, non-tender; gravid, FH appropriate for GA Extremities: No calf swelling or tenderness Presentation: cephalic Fetal monitoring: 140-145 HR/ moderate variability, acels present and variable decels Uterine activity: minimal    Prenatal labs: ABO, Rh: --/--/A POS, A POS Performed at [facility], [address] [place], [place] [address]  [id number](07/01 1606) Antibody: NEG (07/01 1606) Rubella: 5.62 (12/09 1005) RPR: Non Reactive (04/07 0901)  HBsAg: Negative (12/09 1005)  HIV: Non Reactive (04/07 0901)  GC/Chlamydia: Negative GBS:  Positive  2-hr GTT: normal Genetic screening:  normal Anatomy US: normal  Prenatal Transfer Tool  Maternal Diabetes: No 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 Maternal Ultrasounds/Referrals: Normal Fetal Ultrasounds or other Referrals:  None Maternal Substance Abuse:  No Significant Maternal Medications:  None Significant Maternal Lab Results: Group B Strep positive  Results for orders placed or performed during the hospital encounter of 01/29/19 (from the past 24 hour(s))  CBC   Collection Time: 01/29/19  4:06 PM  Result Value Ref Range   WBC 9.5 4.0 - 10.5 K/uL   RBC 3.65 (L) 3.87 - 5.11 MIL/uL   Hemoglobin 9.9 (L) 12.0 - 15.0 g/dL   HCT 40.931.0 (L) 81.136.0 - 91.446.0 %   MCV 84.9 80.0 - 100.0 fL   MCH 27.1 26.0 - 34.0 pg   MCHC 31.9 30.0 - 36.0 g/dL   RDW 78.213.4 95.611.5 - 21.315.5 %   Platelets 296 150 - 400 K/uL   nRBC 0.0 0.0 - 0.2 %  Comprehensive metabolic panel   Collection Time: 01/29/19  4:06 PM  Result Value Ref Range   Sodium 134 (L) 135 - 145 mmol/L   Potassium 3.4 (L) 3.5 - 5.1 mmol/L   Chloride 103 98 - 111 mmol/L   CO2 21 (L) 22 - 32 mmol/L   Glucose, Bld 192 (H) 70 - 99 mg/dL   BUN 6 6 - 20 mg/dL   Creatinine, Ser 0.860.74 0.44 - 1.00 mg/dL   Calcium 8.6 (L) 8.9 - 10.3 mg/dL   Total Protein 6.0 (L) 6.5 - 8.1 g/dL   Albumin 2.6 (L) 3.5 - 5.0 g/dL   AST 16 15 - 41 U/L   ALT 10  0 - 44 U/L   Alkaline Phosphatase 77 38 - 126 U/L   Total Bilirubin 0.4 0.3 - 1.2 mg/dL   GFR calc non Af Amer >60 >60 mL/min   GFR calc Af Amer >60 >60 mL/min   Anion gap 10 5 -  15  Type and screen [place]   Collection Time: 01/29/19  4:06 PM  Result Value Ref Range   ABO/RH(D) A POS    Antibody Screen NEG    Sample Expiration      02/01/2019,2359 Performed at [facility], [place] [address] [place], [place] [address]   ABO/Rh   Collection Time: 01/29/19  4:06 PM  Result Value Ref Range   ABO/RH(D)      A POS Performed at [facility] [address], [place], [place] [address]   Urinalysis, Routine w reflex microscopic   Collection Time: 01/29/19  4:08 PM  Result Value Ref Range   Color, Urine STRAW (A) YELLOW   APPearance CLEAR CLEAR   Specific Gravity, Urine 1.008 1.005 - 1.030   pH 7.0 5.0 - 8.0   Glucose, UA NEGATIVE NEGATIVE mg/dL   Hgb urine dipstick NEGATIVE NEGATIVE   Bilirubin Urine NEGATIVE NEGATIVE   Ketones, ur NEGATIVE NEGATIVE mg/dL   Protein, ur NEGATIVE NEGATIVE mg/dL   Nitrite NEGATIVE NEGATIVE   Leukocytes,Ua LARGE (A) NEGATIVE   RBC / HPF 0-5 0 - 5 RBC/hpf   WBC, UA 0-5 0 - 5 WBC/hpf   Bacteria, UA RARE (A) NONE SEEN   Squamous Epithelial / LPF 0-5 0 - 5   Mucus PRESENT   Protein / creatinine ratio, urine   Collection Time: 01/29/19  4:08 PM  Result Value Ref Range   Creatinine, Urine 43.61 mg/dL   Total Protein, Urine <6 mg/dL   Protein Creatinine Ratio        0.00 - 0.15 mg/mg[Cre]    Patient Active Problem List   Diagnosis Date Noted  . Post-dates pregnancy 01/29/2019  . Anemia affecting pregnancy in third trimester 11/13/2018  . Supervision of other normal pregnancy, antepartum 07/08/2018    Assessment: Shantay Sonn is a 28 y.o. [id number] at [redacted]w[redacted]d here for IOL for variable decels noted in MAU given >[redacted] weeks GA.   #Labor: cervix is unfavorable (1cm) foley balloon has been placed. Given multiparous cervix  will hold off on giving cytotec as suspect FB will be out in less than 4 hours.  #Pain: Minimal, maternal support  #FWB: CAT I #ID:  GBS positive, PCN for prophylaxis  #MOF: Breast #MOC: Depo then OCPs #Circ:  Yes  Simone Autry-Lott 01/29/2019, 7:11 PM

## 2019-01-29 NOTE — MAU Note (Signed)
Pt states she has had a headache for the last 3 days.   Pt reports blurred vision and seeing spots that started yesterday.  Pt called the office to report symptoms and she was told to come in.    Reports +FM  Denies vaginal bleeding or LOF.

## 2019-01-29 NOTE — MAU Provider Note (Signed)
[name] is a 28 y.o. [phone] at [redacted]w[redacted]d who presents to MAU today for BP evaluation. Admission statu was decided and request was made for [place] to confirm presentation.   Pt informed that the ultrasound is considered a limited OB ultrasound and is not intended to be a complete ultrasound exam.  Patient also informed that the ultrasound is not being completed with the intent of assessing for fetal or placental anomalies or any pelvic abnormalities.  Explained that the purpose of today's ultrasound is to assess for  presentation.  Patient acknowledges the purpose of the exam and the limitations of the study.    Cephalic presentation confirmed  RN to transport patient to L&D as planned.   Luvenia Redden, PA-C 01/29/2019 6:24 PM

## 2019-01-29 NOTE — MAU Provider Note (Signed)
History     CSN: [id number]  Arrival date and time: 01/29/19 1519   First Provider Initiated Contact with Patient 01/29/19 1552      Chief Complaint  Patient presents with  . Headache  . Blurred Vision   [name] is a 28 y.o. [phone] at 1227w4d who presents to MAU for preeclampsia evaluation after a HA x3days. Pt reports she called the office to report her symptoms and was advised to come to MAU for a preeclampsia evaluation. Pt reports HA is getting worse, and reports taking a Tyelnol (500mg ) yesterday evening that did not improve her HA. Pt rates HA as 6/10 currently, last night was 10/10. Pt denies hx of HA issues.  Pt also reports seeing spots since yesterday that are present most of the time. Pt describes these vision changes as floaters.  Pt denies N/V, epigastric pain, swelling in face and hands, sudden weight gain. Pt denies chest pain and SOB.  Pt denies constipation, diarrhea, or urinary problems. Pt denies fever, chills, fatigue, sweating or changes in appetite. Pt denies dizziness, light-headedness, weakness.  Pt denies VB, ctx, LOF and reports good FM.  Current pregnancy problems? Yeast infection, GBS POSITIVE Blood Type? A Positive Allergies? NKDA Current medications? PNVs, Fe Current PNC & next appt? [facility], 01/30/2019   OB History    Gravida  4   Para  2   Term  2   Preterm      AB  1   Living  2     SAB      TAB      Ectopic      Multiple      Live Births  2           Past Medical History:  Diagnosis Date  . Medical history non-contributory     Past Surgical History:  Procedure Laterality Date  . NO PAST SURGERIES      Family History  Problem Relation Age of Onset  . Diabetes Mother   . Heart disease Mother   . Kidney disease Mother   . Hypertension Mother   . Cancer Maternal Grandmother   . Cancer Paternal Grandmother     Social History   Tobacco Use  . Smoking status: Never Smoker  . Smokeless tobacco: Never  Used  Substance Use Topics  . Alcohol use: Not Currently    Comment: socially  . Drug use: Never    Allergies: No Known Allergies  Medications Prior to Admission  Medication Sig Dispense Refill Last Dose  . Blood Pressure Monitoring (BLOOD PRESSURE MONITOR/M CUFF) MISC 1 each by Does not apply route as needed. 1 each 0 01/28/2019 at Unknown time  . ferrous sulfate (FERROUSUL) 325 (65 FE) MG tablet Take 1 tablet (325 mg total) by mouth 2 (two) times daily. 60 tablet 1 Past Week at Unknown time  . Prenatal Vit-Fe Phos-FA-Omega (VITAFOL GUMMIES) 3.33-0.333-34.8 MG CHEW Chew 3 each by mouth daily. 90 tablet 12 01/28/2019 at Unknown time  . metroNIDAZOLE (FLAGYL) 500 MG tablet Take 1 tablet (500 mg total) by mouth 2 (two) times daily. 14 tablet 0 More than a month at Unknown time  . terconazole (TERAZOL 7) 0.4 % vaginal cream Place 1 applicator vaginally at bedtime. 45 g 0 More than a month at Unknown time    Review of Systems  Constitutional: Negative for chills, diaphoresis, fatigue and fever.  Eyes: Positive for visual disturbance.  Respiratory: Negative for shortness of breath.   Cardiovascular: Negative  for chest pain.  Gastrointestinal: Negative for abdominal pain, constipation, diarrhea, nausea and vomiting.  Genitourinary: Negative for dysuria, flank pain, frequency, pelvic pain, urgency, vaginal bleeding and vaginal discharge.  Neurological: Positive for headaches. Negative for dizziness, weakness and light-headedness.   Physical Exam   Blood pressure 128/68, pulse 90, temperature 98.6 F (37 C), resp. rate 12, weight 102.8 kg, last menstrual period 04/20/2018, SpO2 98 %.  Patient Vitals for the past 24 hrs:  BP Temp Pulse Resp SpO2 Weight  01/29/19 1716 128/68 - 90 - - -  01/29/19 1701 120/82 - 91 - - -  01/29/19 1646 117/64 - 82 - - -  01/29/19 1631 (!) 112/57 - 83 - - -  01/29/19 1616 123/65 - 81 - - -  01/29/19 1601 131/68 - 80 - - -  01/29/19 1547 138/79 - 96 - - 102.8  kg  01/29/19 1538 138/80 - 89 - - -  01/29/19 1536 138/80 98.6 F (37 C) 88 12 98 % -   Physical Exam  Constitutional: She is oriented to person, place, and time. She appears well-developed and well-nourished. No distress.  HENT:  Head: Normocephalic and atraumatic.  Respiratory: Effort normal.  GI: Soft. She exhibits no distension and no mass. There is no abdominal tenderness. There is no rebound and no guarding.  Neurological: She is alert and oriented to person, place, and time.  Skin: Skin is warm and dry. She is not diaphoretic.  Psychiatric: She has a normal mood and affect. Her behavior is normal. Judgment and thought content normal.   Results for orders placed or performed during the hospital encounter of 01/29/19 (from the past 24 hour(s))  CBC     Status: Abnormal   Collection Time: 01/29/19  4:06 PM  Result Value Ref Range   WBC 9.5 4.0 - 10.5 K/uL   RBC 3.65 (L) 3.87 - 5.11 MIL/uL   Hemoglobin 9.9 (L) 12.0 - 15.0 g/dL   HCT 04.531.0 (L) 40.936.0 - 81.146.0 %   MCV 84.9 80.0 - 100.0 fL   MCH 27.1 26.0 - 34.0 pg   MCHC 31.9 30.0 - 36.0 g/dL   RDW 91.413.4 78.211.5 - 95.615.5 %   Platelets 296 150 - 400 K/uL   nRBC 0.0 0.0 - 0.2 %  Comprehensive metabolic panel     Status: Abnormal   Collection Time: 01/29/19  4:06 PM  Result Value Ref Range   Sodium 134 (L) 135 - 145 mmol/L   Potassium 3.4 (L) 3.5 - 5.1 mmol/L   Chloride 103 98 - 111 mmol/L   CO2 21 (L) 22 - 32 mmol/L   Glucose, Bld 192 (H) 70 - 99 mg/dL   BUN 6 6 - 20 mg/dL   Creatinine, Ser 2.130.74 0.44 - 1.00 mg/dL   Calcium 8.6 (L) 8.9 - 10.3 mg/dL   Total Protein 6.0 (L) 6.5 - 8.1 g/dL   Albumin 2.6 (L) 3.5 - 5.0 g/dL   AST 16 15 - 41 U/L   ALT 10 0 - 44 U/L   Alkaline Phosphatase 77 38 - 126 U/L   Total Bilirubin 0.4 0.3 - 1.2 mg/dL   GFR calc non Af Amer >60 >60 mL/min   GFR calc Af Amer >60 >60 mL/min   Anion gap 10 5 - 15  Urinalysis, Routine w reflex microscopic     Status: Abnormal   Collection Time: 01/29/19  4:08 PM   Result Value Ref Range   Color, Urine STRAW (A) YELLOW  APPearance CLEAR CLEAR   Specific Gravity, Urine 1.008 1.005 - 1.030   pH 7.0 5.0 - 8.0   Glucose, UA NEGATIVE NEGATIVE mg/dL   Hgb urine dipstick NEGATIVE NEGATIVE   Bilirubin Urine NEGATIVE NEGATIVE   Ketones, ur NEGATIVE NEGATIVE mg/dL   Protein, ur NEGATIVE NEGATIVE mg/dL   Nitrite NEGATIVE NEGATIVE   Leukocytes,Ua LARGE (A) NEGATIVE   RBC / HPF 0-5 0 - 5 RBC/hpf   WBC, UA 0-5 0 - 5 WBC/hpf   Bacteria, UA RARE (A) NONE SEEN   Squamous Epithelial / LPF 0-5 0 - 5   Mucus PRESENT   Protein / creatinine ratio, urine     Status: None   Collection Time: 01/29/19  4:08 PM  Result Value Ref Range   Creatinine, Urine 43.61 mg/dL   Total Protein, Urine <6 mg/dL   Protein Creatinine Ratio        0.00 - 0.15 mg/mg[Cre]   No results found.  MAU Course  Procedures  MDM -preeclampsia work-up with HA and spots in vision -UA: straw/lg hgb/rare bacteria, sending urine for culture -CBC: H/H 9.9/31 (pt currently taking oral iron, H/H was 10/31.6 two months ago) -CMP: no abnormalities requiring treatment, AST/ALT 16/10, Glucose 192 -PCr: result below reportable range -Flexeril 10mg  given for HA, after administration, pt reports HA is 0/10, however, then pt got up to use bathroom and reports HA is still 6/10, but only with movement -EFM: reactive with variables       -baseline: 145/150       -variability: moderate       -accels: present, 15x15       -decels: several variable (largest [phone])       -TOCO: no ctx -called and spoke with Dr. Despina HiddenEure @1718 , per Dr. Despina HiddenEure, will keep for induction -called and notified Dr. Elita Quickat Wallace, requested admission orders to be entered, but Dr. Earlene PlaterWallace confirmed she will review orders for accuracy, care transferred to Dr. Earlene PlaterWallace -admit to L&D  Orders Placed This Encounter  Procedures  . Culture, OB Urine    Standing Status:   Standing    Number of Occurrences:   1  . SARS Coronavirus 2  (CEPHEID - Performed in [facility] lab), [facility] Order    No isolation needed for this testing (if isolation ordered for another indication, maintain current isolation).    Standing Status:   Standing    Number of Occurrences:   1    Order Specific Question:   Rule Out    Answer:   Yes  . Urinalysis, Routine w reflex microscopic    Standing Status:   Standing    Number of Occurrences:   1  . CBC    Standing Status:   Standing    Number of Occurrences:   1  . Comprehensive metabolic panel    Standing Status:   Standing    Number of Occurrences:   1  . Protein / creatinine ratio, urine    Standing Status:   Standing    Number of Occurrences:   1  . RPR    Standing Status:   Standing    Number of Occurrences:   1  . Type and screen [facility]     Standing Status:   Standing    Number of Occurrences:   1   Meds ordered this encounter  Medications  . cyclobenzaprine (FLEXERIL) tablet 10 mg  . lactated ringers infusion  Assessment and Plan   -admit to L&D for induction -care transferred to Dr. Robbie Louis Tremell Reimers 01/29/2019, 5:37 PM

## 2019-01-30 ENCOUNTER — Encounter (HOSPITAL_COMMUNITY): Payer: Self-pay | Admitting: Family Medicine

## 2019-01-30 ENCOUNTER — Encounter: Payer: 59 | Admitting: Obstetrics and Gynecology

## 2019-01-30 DIAGNOSIS — Z3A40 40 weeks gestation of pregnancy: Secondary | ICD-10-CM

## 2019-01-30 DIAGNOSIS — O288 Other abnormal findings on antenatal screening of mother: Secondary | ICD-10-CM | POA: Diagnosis present

## 2019-01-30 DIAGNOSIS — O99824 Streptococcus B carrier state complicating childbirth: Secondary | ICD-10-CM

## 2019-01-30 DIAGNOSIS — O480 Post-term pregnancy: Secondary | ICD-10-CM

## 2019-01-30 LAB — ABO/RH: ABO/RH(D): A POS

## 2019-01-30 LAB — RPR: RPR Ser Ql: NONREACTIVE

## 2019-01-30 MED ORDER — ONDANSETRON HCL 4 MG/2ML IJ SOLN: 4.0000 mg | INTRAMUSCULAR | Status: DC | PRN

## 2019-01-30 MED ORDER — TETANUS-DIPHTH-ACELL PERTUSSIS 5-2.5-18.5 LF-MCG/0.5 IM SUSP: 0.5000 mL | Freq: Once | INTRAMUSCULAR | Status: DC

## 2019-01-30 MED ORDER — MISOPROSTOL 50MCG HALF TABLET
50.0000 ug | ORAL_TABLET | ORAL | Status: DC
  Administered 2019-01-30: 50 ug via BUCCAL

## 2019-01-30 MED ORDER — COCONUT OIL OIL: 1.0000 "application " | TOPICAL_OIL | Status: DC | PRN

## 2019-01-30 MED ORDER — ACETAMINOPHEN 325 MG PO TABS
650.0000 mg | ORAL_TABLET | ORAL | Status: DC | PRN
  Administered 2019-01-30 (×2): 650 mg via ORAL
  Filled 2019-01-30 (×2): qty 2

## 2019-01-30 MED ORDER — DIBUCAINE (PERIANAL) 1 % EX OINT: 1.0000 "application " | TOPICAL_OINTMENT | CUTANEOUS | Status: DC | PRN

## 2019-01-30 MED ORDER — IBUPROFEN 800 MG PO TABS
800.0000 mg | ORAL_TABLET | Freq: Three times a day (TID) | ORAL | Status: DC
  Administered 2019-01-30 – 2019-01-31 (×4): 800 mg via ORAL
  Filled 2019-01-30 (×4): qty 1

## 2019-01-30 MED ORDER — DOCUSATE SODIUM 100 MG PO CAPS
100.0000 mg | ORAL_CAPSULE | Freq: Two times a day (BID) | ORAL | Status: DC
  Administered 2019-01-30 – 2019-01-31 (×2): 100 mg via ORAL
  Filled 2019-01-30 (×2): qty 1

## 2019-01-30 MED ORDER — CALCIUM CARBONATE ANTACID 500 MG PO CHEW
400.0000 mg | CHEWABLE_TABLET | Freq: Two times a day (BID) | ORAL | Status: DC | PRN
  Administered 2019-01-30: 13:00:00 400 mg via ORAL
  Filled 2019-01-30: qty 2

## 2019-01-30 MED ORDER — ONDANSETRON HCL 4 MG PO TABS: 4.0000 mg | ORAL_TABLET | ORAL | Status: DC | PRN

## 2019-01-30 MED ORDER — MISOPROSTOL 50MCG HALF TABLET
ORAL_TABLET | ORAL | Status: AC
  Filled 2019-01-30: qty 1

## 2019-01-30 MED ORDER — WITCH HAZEL-GLYCERIN EX PADS: 1.0000 "application " | MEDICATED_PAD | CUTANEOUS | Status: DC | PRN

## 2019-01-30 MED ORDER — MEASLES, MUMPS & RUBELLA VAC IJ SOLR: 0.5000 mL | Freq: Once | INTRAMUSCULAR | Status: DC

## 2019-01-30 MED ORDER — BENZOCAINE-MENTHOL 20-0.5 % EX AERO
1.0000 "application " | INHALATION_SPRAY | CUTANEOUS | Status: DC | PRN
  Administered 2019-01-30: 1 via TOPICAL
  Filled 2019-01-30: qty 56

## 2019-01-30 MED ORDER — PRENATAL MULTIVITAMIN CH
1.0000 | ORAL_TABLET | Freq: Every day | ORAL | Status: DC
  Administered 2019-01-30: 11:00:00 1 via ORAL
  Filled 2019-01-30: qty 1

## 2019-01-30 MED ORDER — TERBUTALINE SULFATE 1 MG/ML IJ SOLN
0.2500 mg | Freq: Once | INTRAMUSCULAR | Status: AC | PRN
  Administered 2019-01-30: 01:00:00 0.25 mg via SUBCUTANEOUS
  Filled 2019-01-30: qty 1

## 2019-01-30 MED ORDER — DIPHENHYDRAMINE HCL 25 MG PO CAPS: 25.0000 mg | ORAL_CAPSULE | Freq: Four times a day (QID) | ORAL | Status: DC | PRN

## 2019-01-30 MED ORDER — SIMETHICONE 80 MG PO CHEW: 80.0000 mg | CHEWABLE_TABLET | ORAL | Status: DC | PRN

## 2019-01-30 NOTE — Discharge Summary (Signed)
Obstetrics Discharge Summary OB/GYN Faculty Practice   [name] DOB: 1990/10/02 MRN: [id number]  Date of admission: 01/29/2019 Delivering MD: Tamera StandsWALLACE, LAUREL S   Date of discharge: 01/31/2019  Admitting diagnosis: Pre-Eclampsia Eval  Intrauterine pregnancy: 6236w5d     Secondary diagnosis:   Principal Problem:   Non-reactive NST (non-stress test) Active Problems:   Anemia affecting pregnancy in third trimester   Post-dates pregnancy    Discharge diagnosis: Term Pregnancy Delivered                                            Postpartum procedures: Depo Provera Complications: none  Hospital course: Becky Cochran is a 28 y.o. 7836w5d who was admitted for NRNST past term. Her pregnancy was complicated by above  noted. Her labor course was notable for starting induction with FB then buccal cytotec and AROM with quick progression to complete. Delivery was uncomplicated. Please see delivery/op note for additional details. Her postpartum course was uncomplicated. She was breastfeeding without difficulty. By day of discharge, she was passing flatus, urinating, eating and drinking without difficulty. Her pain was well-controlled, and she was discharged home with Ibuprofen. She will follow-up in clinic in 4-6 weeks.   Physical exam  Vitals:   01/30/19 0932 01/30/19 1328 01/30/19 2134 01/31/19 0533  BP: (!) 118/59 115/67 118/67 114/64  Pulse: 64 61 66 71  Resp: 18 20 18 20   Temp: 98.3 F (36.8 C) 98.1 F (36.7 C) 97.8 F (36.6 C) 97.6 F (36.4 C)  TempSrc: Oral Axillary Axillary Oral  SpO2: 98%  99% 98%  Weight:      Height:       General: a&o x3, NAD Lochia: appropriate Uterine Fundus: firm Incision: N/A DVT Evaluation: No evidence of DVT seen on physical exam. Negative Homan's sign. No cords or calf tenderness. No significant calf/ankle edema.  Labs: Lab Results  Component Value Date   WBC 9.5 01/29/2019   HGB 9.9 (L) 01/29/2019   HCT 31.0 (L) 01/29/2019   MCV  84.9 01/29/2019   PLT 296 01/29/2019   CMP Latest Ref Rng & Units 01/29/2019  Glucose 70 - 99 mg/dL 469(G192(H)  BUN 6 - 20 mg/dL 6  Creatinine 2.950.44 - 2.841.00 mg/dL 1.320.74  Sodium 440135 - 102145 mmol/L 134(L)  Potassium 3.5 - 5.1 mmol/L 3.4(L)  Chloride 98 - 111 mmol/L 103  CO2 22 - 32 mmol/L 21(L)  Calcium 8.9 - 10.3 mg/dL 7.2(Z8.6(L)  Total Protein 6.5 - 8.1 g/dL 6.0(L)  Total Bilirubin 0.3 - 1.2 mg/dL 0.4  Alkaline Phos 38 - 126 U/L 77  AST 15 - 41 U/L 16  ALT 0 - 44 U/L 10    Discharge instructions: Per After Visit Summary and "Baby and Me Booklet"  After visit meds:  Allergies as of 01/31/2019   No Known Allergies     Medication List    STOP taking these medications   acetaminophen 500 MG tablet Commonly known as: TYLENOL   Blood Pressure Monitor/M Cuff Misc   calcium carbonate 500 MG chewable tablet Commonly known as: TUMS - dosed in mg elemental calcium   ferrous sulfate 325 (65 FE) MG tablet Commonly known as: FerrouSul     TAKE these medications   ibuprofen 800 MG tablet Commonly known as: ADVIL Take 1 tablet (800 mg total) by mouth 3 (three) times daily.   Vitafol Gummies 3.33-0.333-34.8  MG Chew Chew 3 each by mouth daily.       Postpartum contraception: Depo Provera Diet: Routine Diet Activity: Advance as tolerated. Pelvic rest for 6 weeks.   Follow-up Appt: Future Appointments  Date Time Provider [place]  03/13/2019 11:10 AM Laury Deep, CNM [facility] None   Follow-up Visit:No follow-ups on file. Please schedule this patient for Postpartum visit in: 4 weeks with the following provider: Any provider Low risk pregnancy complicated by: none Delivery mode:  SVD Anticipated Birth Control:  Depo PP Procedures needed: none  Schedule Integrated BH visit: no  Newborn Data: Live born female  Birth Weight: 6 lb 10.5 oz (3019 g) APGAR: 8, 9  Newborn Delivery   Birth date/time: 01/30/2019 02:58:00 Delivery type: Vaginal, Spontaneous      Baby Feeding:  Breast Disposition:home with mother

## 2019-01-30 NOTE — Progress Notes (Signed)
Interval Progress Note  Called into room because patient complaining of intense pain and having prolonged deceleration. On entry into room, FHR in 80s and trying repositions. Per RN, FB just removed, 4cm dilated and uterus palpates firm. Trendelenburg and IM terbutaline given. Bolus running, oxygen by nasal cannula. Rechecked SVE and now 6/70/0 with BBOW, responding to fetal scalp stimulation. FHR improved to 150s, contractions spaced to about every 5 minutes. Lying on left side, now Category I tracing. Anticipate deceleration related to quick progression in setting of recently receiving cytotec. Stable now, FHR tracing much improved. Anticipate SVD.  Lambert Mody. Juleen China, DO OB/GYN Fellow

## 2019-01-30 NOTE — Progress Notes (Signed)
OB/GYN Faculty Practice: Labor Progress Note  Subjective: Into room to check patient and discuss AROM. Plan of care discussed with RN - not having as many accelerations in FHR.   Objective: BP 115/60   Pulse 68   Temp 98.6 F (37 C) (Axillary)   Resp 20   Ht 5\' 6"  (1.676 m)   Wt 102.8 kg   LMP 04/20/2018 (Exact Date)   SpO2 97%   BMI 36.57 kg/m  Gen: uncomfortable appearing during contractions Dilation: 7 Effacement (%): 80 Station: 0 Presentation: Vertex Exam by:: Danley Danker, MD  Assessment and Plan: 28 y.o. [id number] [redacted]w[redacted]d here for IOL for NRNST.  Labor: Progressing well. Discussed option of AROM to help with augmentation especially since trying to labor without epidural. Reviewed risks/benefits and patient amenable to plan. AROM clear fluid. Anticipate SVD. -- pain control: would prefer not to have epidural  -- PPH Risk: low  Fetal Well-Being: EFW 8-9lbs by [place]. Cephalic by sutures.  -- Category I - continuous fetal monitoring  -- GBS positive    Callaway Hailes S. Juleen China, DO OB/GYN Fellow, Faculty Practice  2:52 AM

## 2019-01-30 NOTE — Lactation Note (Signed)
This note was copied from a baby's chart. Lactation Consultation Note Baby is 2 hrs old.  [name] 3rd child. Om has 78 & 26 yr old that she BF for 1 yr each. Mom states this baby is latching well. Mom states she usually has an over supply of BM and has donated. Reminded mom of cluster feeding, I&O and STS. Encouraged mom to call for questions, concerns or needing assistance. Lactation brochure left at bedside.  [name] [id number] Date: 01/30/2019 Reason for consult: Initial assessment;Term   Maternal Data Has patient been taught Hand Expression?: Yes Does the patient have breastfeeding experience prior to this delivery?: Yes  Feeding Feeding Type: Breast Fed  LATCH Score Latch: Repeated attempts needed to sustain latch, nipple held in mouth throughout feeding, stimulation needed to elicit sucking reflex.  Audible Swallowing: A few with stimulation  Type of Nipple: Everted at rest and after stimulation  Comfort (Breast/Nipple): Soft / non-tender  Hold (Positioning): No assistance needed to correctly position infant at breast.  LATCH Score: 7  Interventions Interventions: Breast feeding basics reviewed  Lactation Tools Discussed/Used WIC Program: Yes   Consult Status Consult Status: PRN Date: 01/31/19 Follow-up type: In-patient    Theodoro Kalata 01/30/2019, 5:36 AM

## 2019-01-30 NOTE — Progress Notes (Signed)
OB/GYN Faculty Practice: Labor Progress Note  *Late entry because of other patient care responsibilities on unit* Subjective: Doing well, comfortable aside from contractions every 10 minutes and feeling some cramping from FB. Recently tugged on FB and still tight. Thinking about getting something to eat.   Objective: BP (!) 108/58   Pulse 82   Temp 98.3 F (36.8 C) (Oral)   Resp 18   Ht 5\' 6"  (1.676 m)   Wt 102.8 kg   LMP 04/20/2018 (Exact Date)   SpO2 100%   BMI 36.57 kg/m  Gen: well-appearing, NAD, lying on side Dilation: 6 Effacement (%): 50 Station: -2 Presentation: Vertex Exam by:: Juleen China, MD  Assessment and Plan: 28 y.o. [id number] [redacted]w[redacted]d here for IOL for NRNST.  Labor: Induction started with FB. Since FB still in place, discussed use of buccal cytotec to help with induction and patient amenable. Will give one dose of buccal cytotec.  -- pain control: would prefer not to have epidural  -- PPH Risk: low  Fetal Well-Being: EFW 8-9lbs by [place]. Cephalic by sutures.  -- Category I - continuous fetal monitoring  -- GBS positive    Inioluwa Boulay S. Juleen China, DO OB/GYN Fellow, Faculty Practice  1:02 AM

## 2019-01-31 LAB — CULTURE, OB URINE: Culture: >=100000 — AB

## 2019-01-31 MED ORDER — MEDROXYPROGESTERONE ACETATE 150 MG/ML IM SUSP
150.0000 mg | Freq: Once | INTRAMUSCULAR | Status: AC
  Administered 2019-01-31: 11:00:00 150 mg via INTRAMUSCULAR
  Filled 2019-01-31: qty 1

## 2019-01-31 MED ORDER — IBUPROFEN 800 MG PO TABS: 800.0000 mg | ORAL_TABLET | Freq: Three times a day (TID) | ORAL | 0 refills | Status: DC

## 2019-01-31 NOTE — Discharge Instructions (Signed)

## 2019-01-31 NOTE — Lactation Note (Signed)
This note was copied from a baby's chart. Lactation Consultation Note  [name] Date: 01/31/2019 Reason for consult: Follow-up assessment;Term;Infant weight loss Baby  Is 19 hours old - 6% weight loss , mom , dad desire early D/C .  Per mom the baby has eaten since early am and has been sleepy.  Is suppose to have a circ before D/C.  LC recommended STS , LC offered to check diaper it was dry.  Baby more awake and mom independently latched/ 1st was using the  Cradle and baby was latching shallow. LC recommended and instructed  'mom verbally how to use the cross cradle and baby latched with depth, fed for 13 mins  With multiple swallows. Per mom comfortable.  Mom denies soreness, sore nipple and engorgement prevention and  tx reviewed.  LC instructed mom on the use of the hand pump.  Per mom prefers the hand pump over the EEBP / has [place] if needed for a DEBP.  Discussed nutritive vs non - nutritive feeding cues and the importance of watching the baby  For hanging out latched . STS feedings until the baby is back to birth weight , gaining steadily and  Can stay awake for majority of feeding.  LC reviewed [place] resources for [facility].    Maternal Data Has patient been taught Hand Expression?: Yes  Feeding Feeding Type: Breast Fed  LATCH Score Latch: Grasps breast easily, tongue down, lips flanged, rhythmical sucking.  Audible Swallowing: Spontaneous and intermittent  Type of Nipple: Everted at rest and after stimulation  Comfort (Breast/Nipple): Soft / non-tender  Hold (Positioning): No assistance needed to correctly position infant at breast.  LATCH Score: 10  Interventions Interventions: Breast feeding basics reviewed;Skin to skin;Breast compression;Adjust position;Support pillows  Lactation Tools Discussed/Used Tools: Pump Breast pump type: Manual WIC Program: Yes Pump Review: Setup, frequency, and cleaning;Milk Storage Initiated by::  MAI Date initiated:: 01/31/19   Consult Status Consult Status: Complete Date: 01/31/19    Myer Haff 01/31/2019, 10:27 AM

## 2019-02-20 ENCOUNTER — Telehealth: Payer: Self-pay | Admitting: *Deleted

## 2019-02-20 DIAGNOSIS — O9122 Nonpurulent mastitis associated with the puerperium: Secondary | ICD-10-CM

## 2019-02-20 MED ORDER — FLUCONAZOLE 100 MG PO TABS: ORAL_TABLET | ORAL | 0 refills | Status: DC

## 2019-02-20 MED ORDER — CEPHALEXIN 500 MG PO CAPS: 500.0000 mg | ORAL_CAPSULE | Freq: Four times a day (QID) | ORAL | 0 refills | Status: AC

## 2019-02-20 NOTE — Telephone Encounter (Signed)
Becky Cochran with [facility] called stating she had a video visit with patient. Pt reported having a fever of 102, breast pain/tenderness. No fever now, however when patient pumps, there is thick mucous out of left nipple.  Derl Barrow, RN    Left voice message for patient to return nurse call.   Derl Barrow, RN

## 2019-02-26 IMAGING — US US OB COMP LESS 14 WK
1 series · 15 of 28 positions shown · non-contrast
Comparison: None.

CLINICAL DATA: Unsure of LMP.

EXAM:
OBSTETRIC <14 WK ULTRASOUND
TECHNIQUE: Transabdominal ultrasound was performed for evaluation of the
gestation as well as the maternal uterus and adnexal regions.

[Series 1: us ob comp less 14 wk · 15 of 42 slices shown]
[im 1/42]
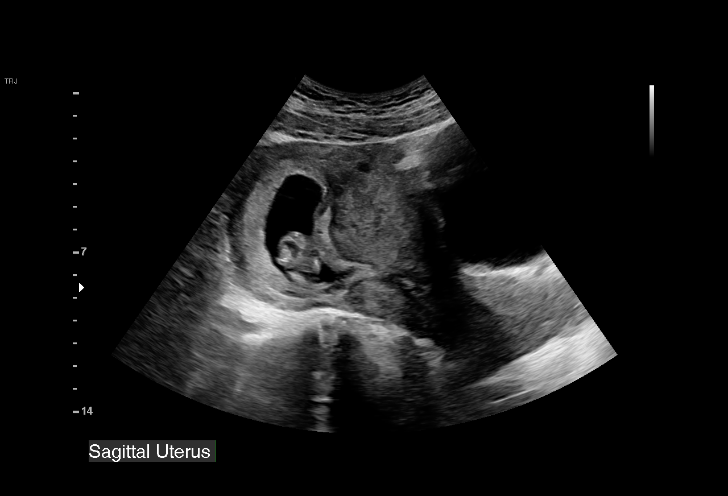
[im 4/42]
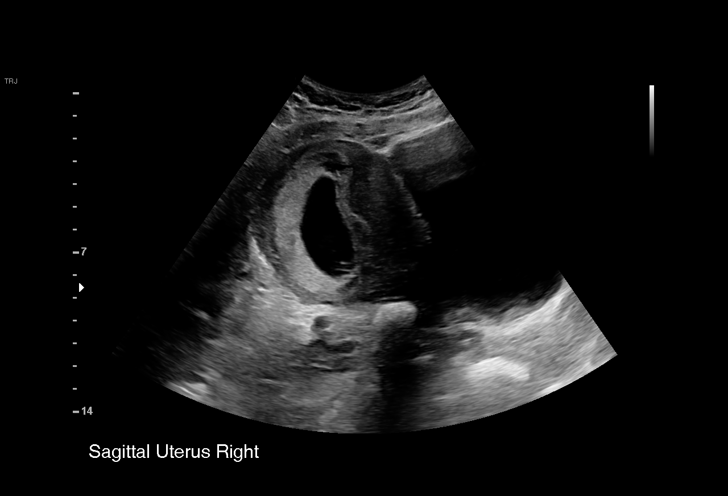
[im 7/42]
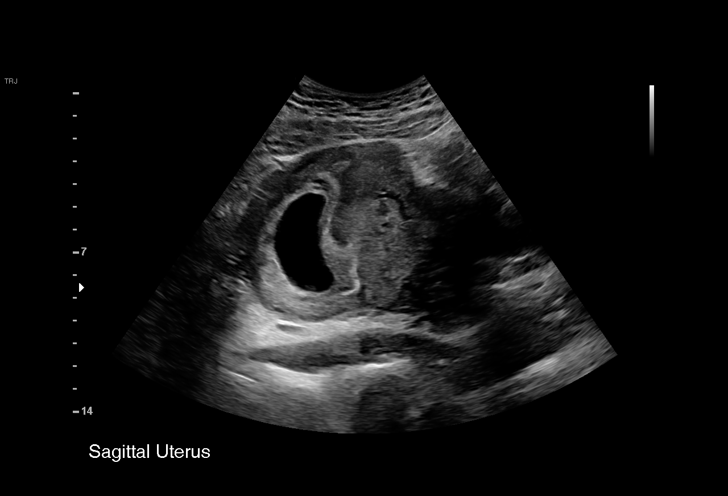
[im 10/42]
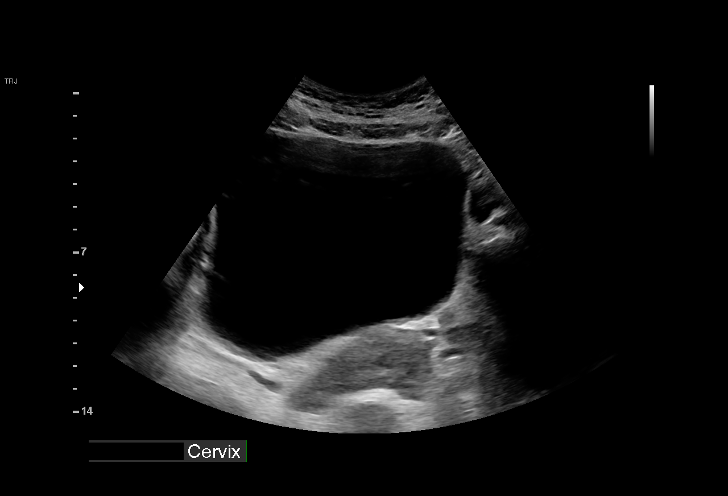
[im 13/42]
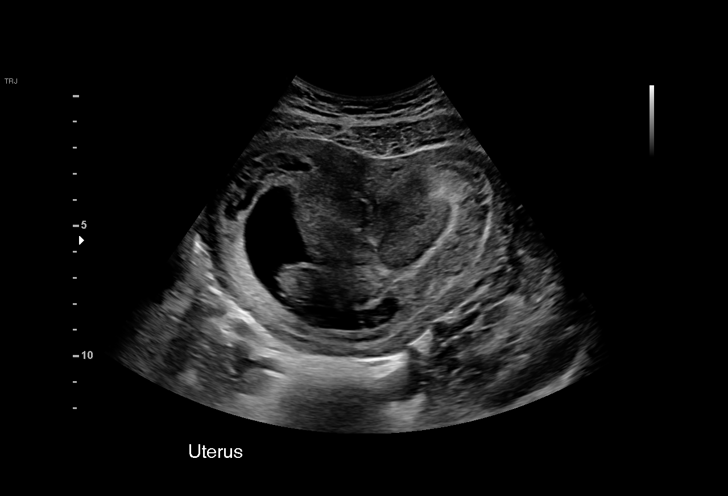
[im 16/42]
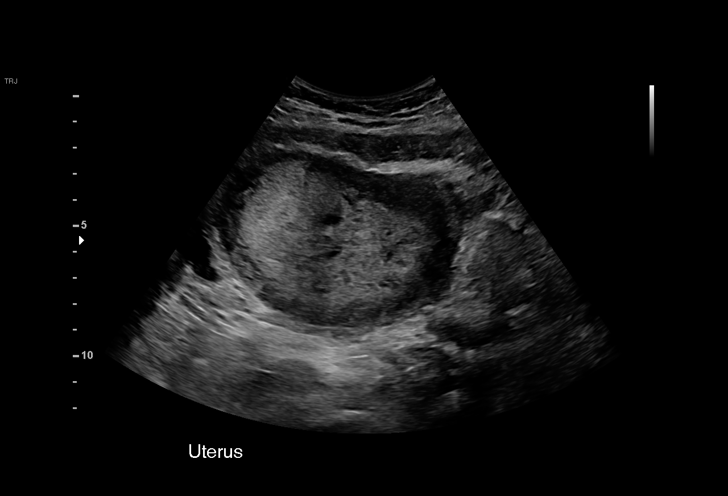
[im 19/42]
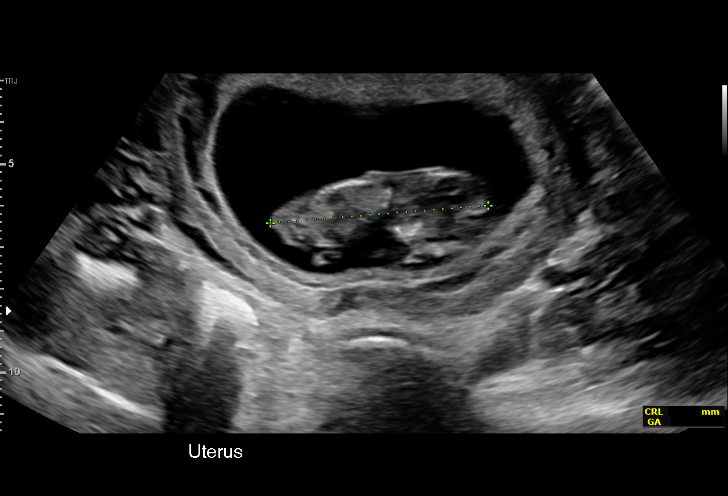
[im 22/42]
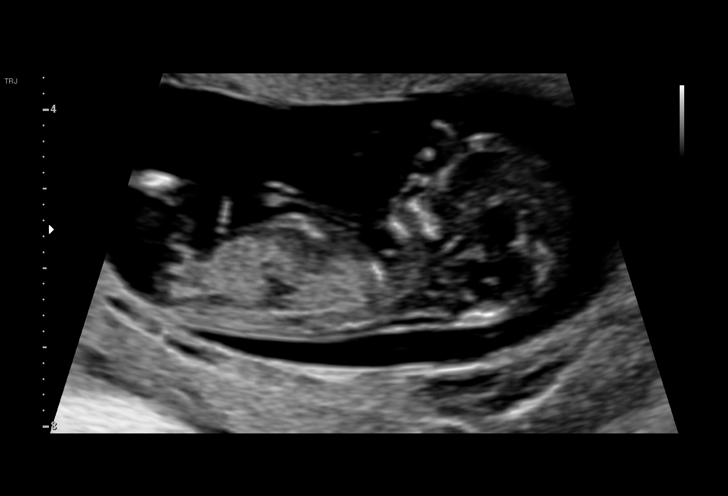
[im 23/42]
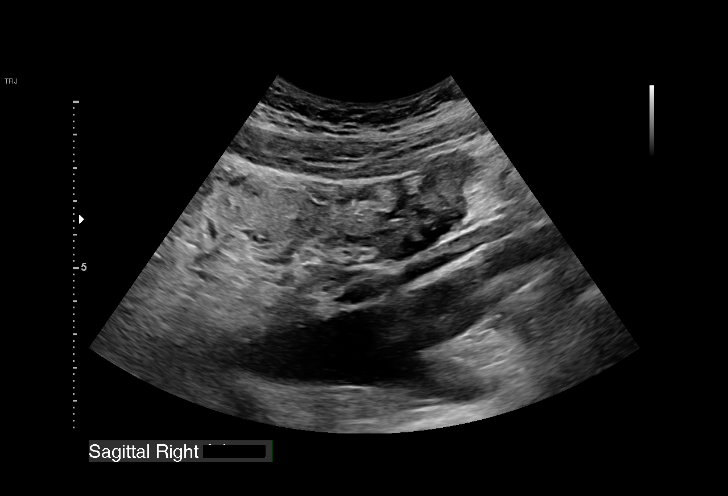
[im 26/42]
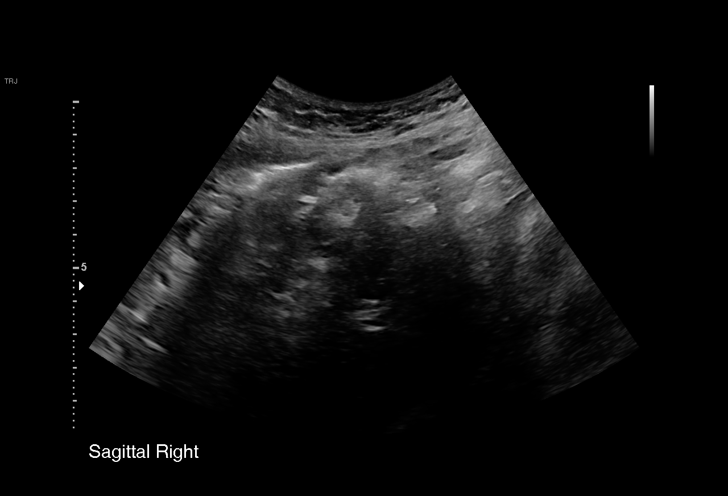
[im 29/42]
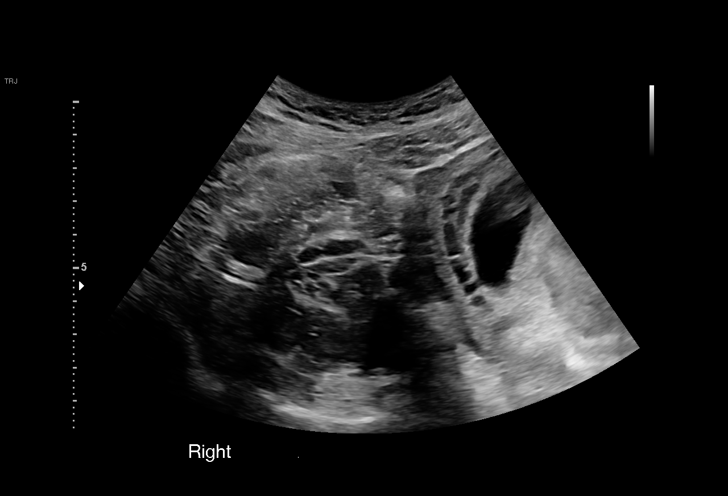
[im 32/42]
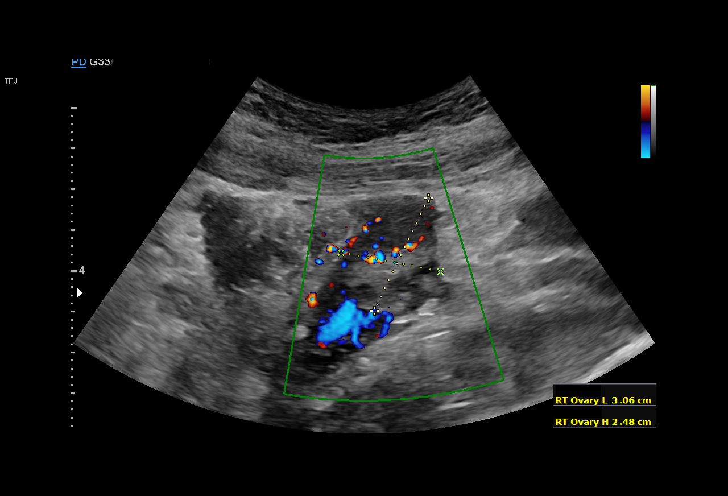
[im 35/42]
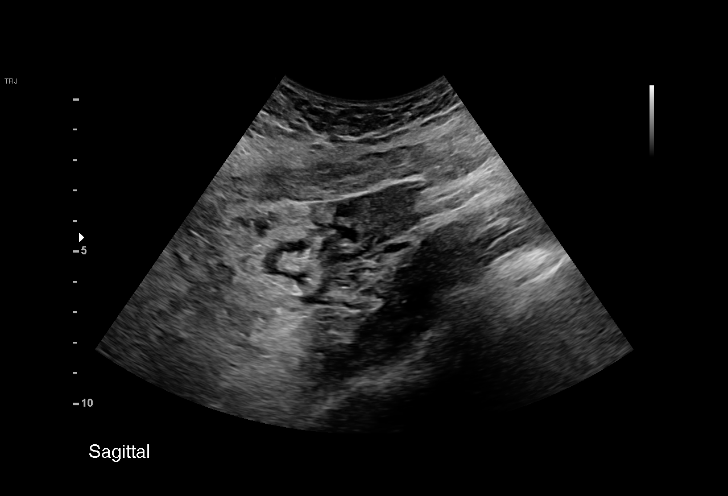
[im 38/42]
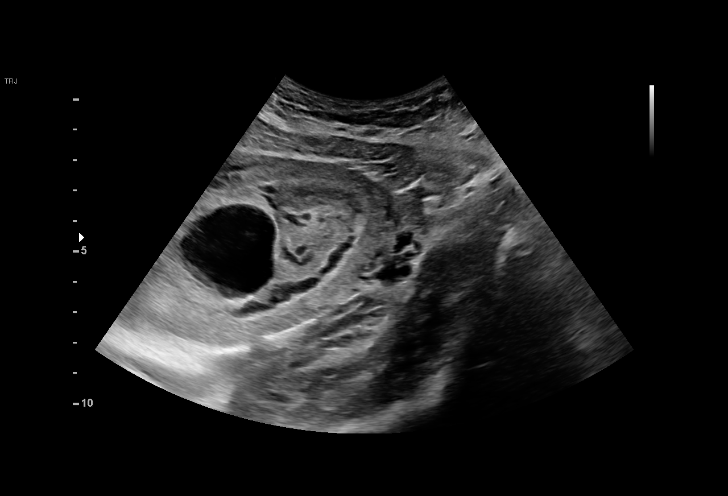
[im 42/42]
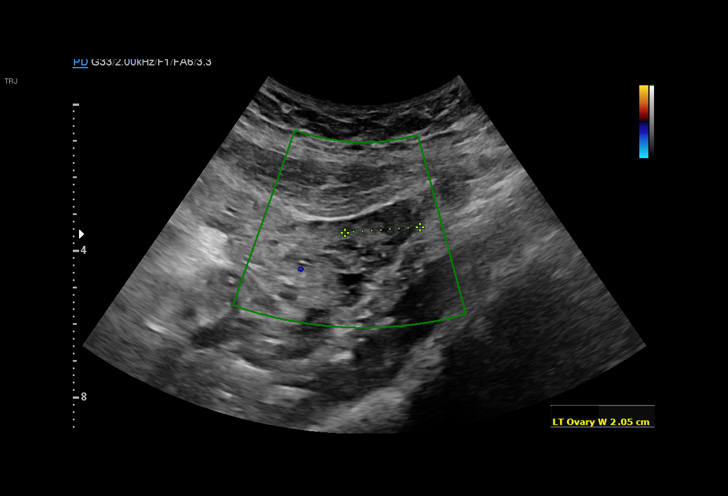

[15 of 28 positions shown; findings below may reference images not displayed]

FINDINGS: Intrauterine gestational sac: Single

Yolk sac:  Not Visualized.

Embryo:  Visualized.

Cardiac Activity: Visualized.

Heart Rate: 161 bpm

CRL:   53 mm   12 w 0 d                  US EDC: 01/22/2019

Subchorionic hemorrhage:  None visualized.

Maternal uterus/adnexae: No fibroids identified. Normal appearance
of both ovaries. No mass or abnormal free fluid identified.
IMPRESSION: Single living IUP measuring 12 weeks 0 days, with US EDC of
01/22/2019.

No significant maternal uterine or adnexal abnormality identified.

## 2019-03-13 ENCOUNTER — Telehealth (INDEPENDENT_AMBULATORY_CARE_PROVIDER_SITE_OTHER): Payer: 59 | Admitting: Obstetrics and Gynecology

## 2019-03-13 ENCOUNTER — Encounter: Payer: Self-pay | Admitting: Obstetrics and Gynecology

## 2019-03-13 DIAGNOSIS — Z1389 Encounter for screening for other disorder: Secondary | ICD-10-CM

## 2019-03-13 NOTE — Progress Notes (Signed)
   TELEHEALTH VIRTUAL POSTPARTUM VISIT ENCOUNTER NOTE  I connected with Becky Cochran on 03/13/19 at 11:10 AM EDT by telephone at home and verified that I am speaking with the correct person using two identifiers.   I discussed the limitations, risks, security and privacy concerns of performing an evaluation and management service by telephone and the availability of in person appointments. I also discussed with the patient that there may be a patient responsible charge related to this service. The patient expressed understanding and agreed to proceed.   History:  Becky Cochran is a 28 y.o. [phone] female who presents for a postpartum visit. She is 6 weeks postpartum following a vaginal delivery. I have fully reviewed the prenatal and intrapartum course. The delivery was at [redacted]w[redacted]d gestational weeks.  Anesthesia: none. Postpartum course has been uncomplicated. Baby's course has been uncomplicated, but "he has had some stomach issues". Baby is feeding by breastfeeding. She reports an "over supply", so she pumps "a lot." She reports having "1000 ml already stored". Bleeding spotting. Bowel function is normal. Bladder function is normal. Patient is sexually active; resumed 1 week ago. Contraception method choice is Depo Provera. Postpartum depression screening: negative: 0 score.        Past Medical History:  Diagnosis Date  . Medical history non-contributory    Past Surgical History:  Procedure Laterality Date  . NO PAST SURGERIES     The following portions of the patient's history were reviewed and updated as appropriate: allergies, current medications, past family history, past medical history, past social history, past surgical history and problem list.   Health Maintenance:  Normal pap on 07/19/2018.   Review of Systems:  Pertinent items noted in HPI and remainder of comprehensive ROS otherwise negative. **VS not taken, because patient driving. Will send via [facility] this afternoon. Physical  Exam:  Physical exam deferred due to nature of the encounter  Assessment and Plan:  Encounter for postpartum care of lactating mother - Normal PP visit - Adjusting well to having a 3rd child, older 2 children (51 & 90 yo) are very helpful - Plan to get Depo in October and every 3 months afterwards, until to decide on switching methods    I discussed the assessment and treatment plan with the patient. The patient was provided an opportunity to ask questions and all were answered. The patient agreed with the plan and demonstrated an understanding of the instructions.   The patient was advised to call back or seek an in-person evaluation/go to the ED if the symptoms worsen or if the condition fails to improve as anticipated.  I provided 10 minutes of non-face-to-face time during this encounter. There was 5 minutes of chart review time spent prior to this encounter. Total time spent = 15 minutes.    Laury Deep, [place] for [place]

## 2019-04-03 IMAGING — US US ABDOMEN LIMITED
1 series · 14 of 25 positions shown · non-contrast
Comparison: None.

CLINICAL DATA: Abdominal pain

EXAM:
ULTRASOUND ABDOMEN LIMITED RIGHT UPPER QUADRANT

[Series 1: us abdomen limited · 14 of 44 slices shown]
[im 1/44]
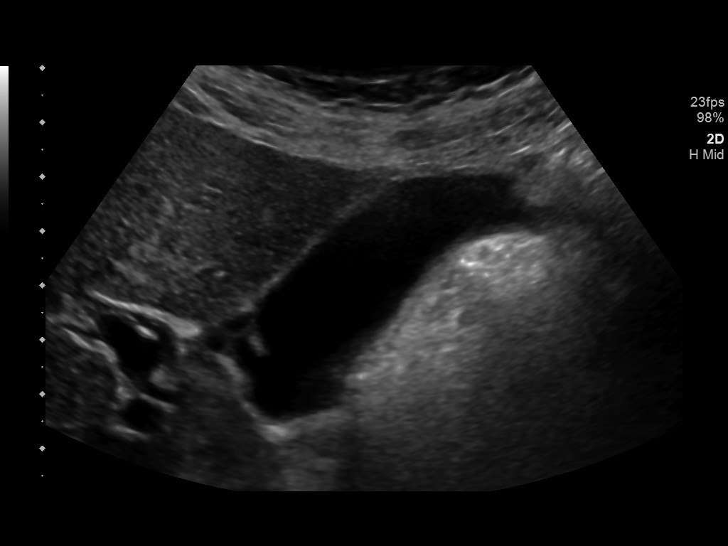
[im 4/44]
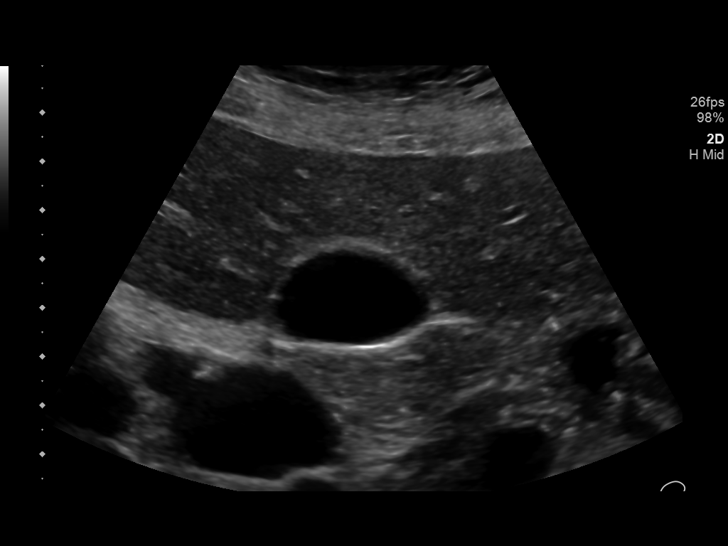
[im 8/44]
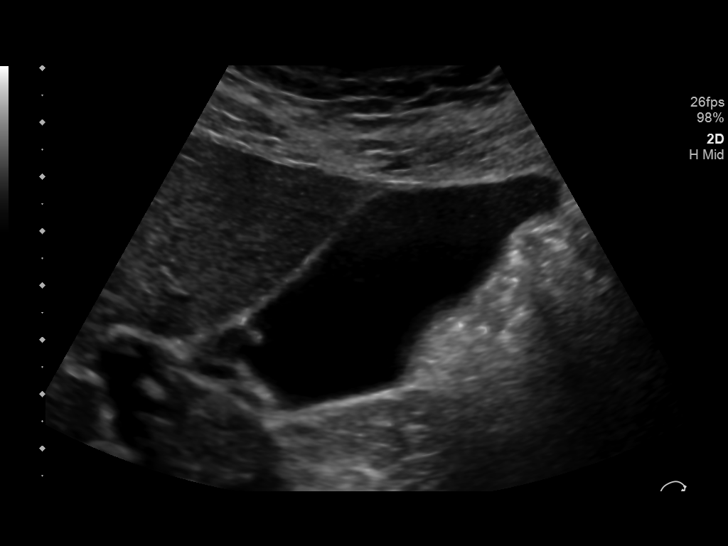
[im 11/44]
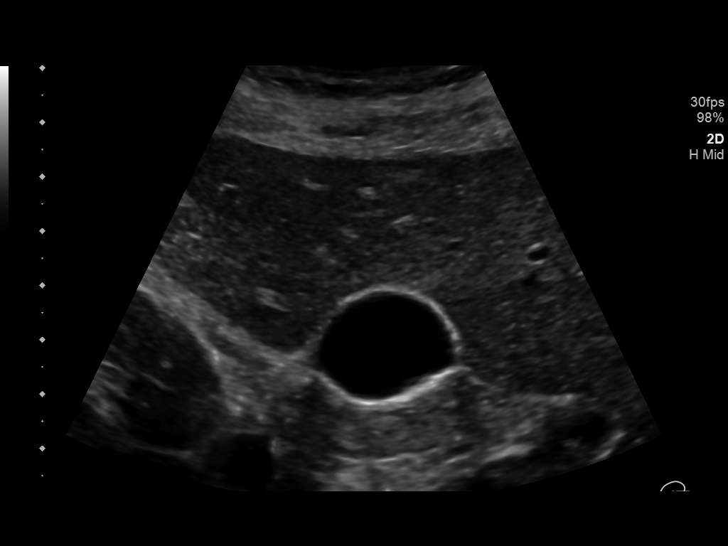
[im 15/44]
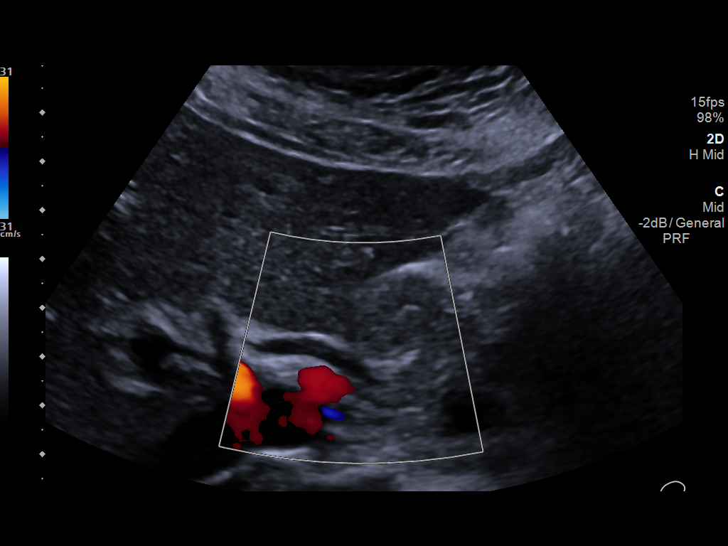
[im 17/44]
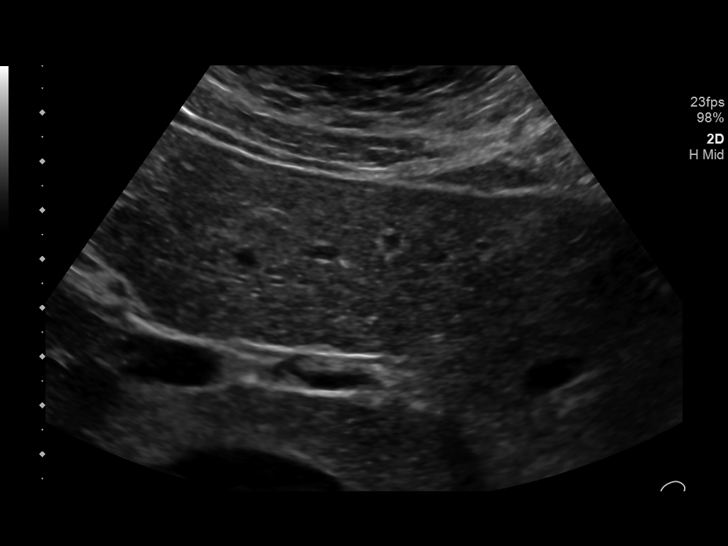
[im 20/44]
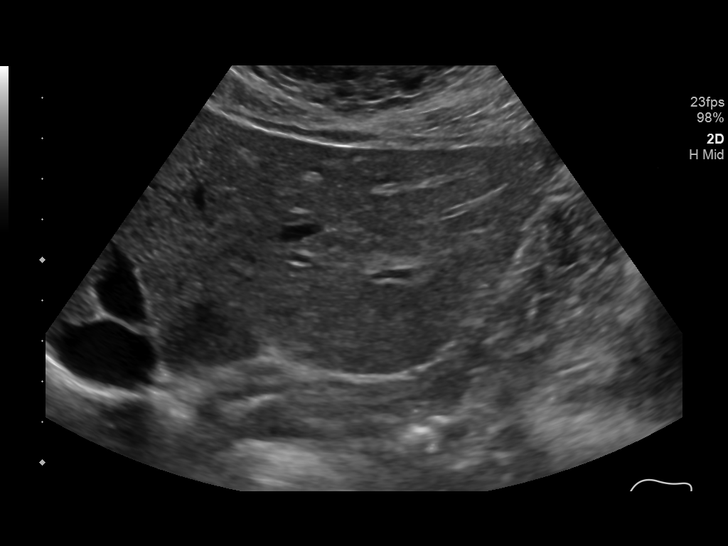
[im 24/44]
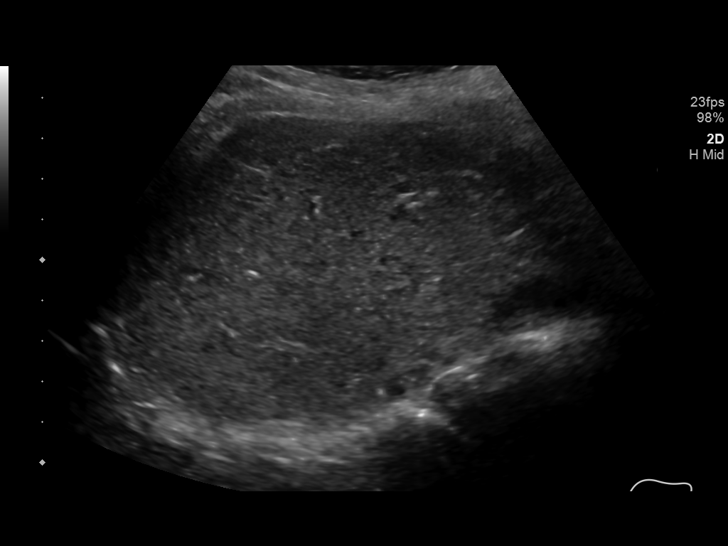
[im 27/44]
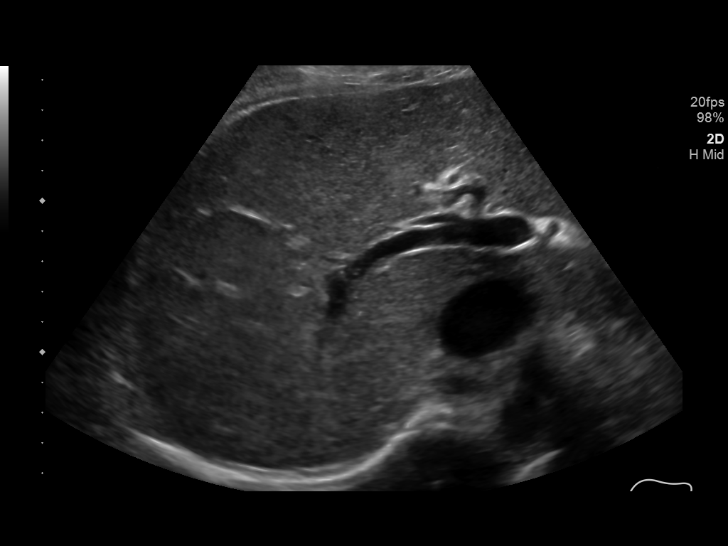
[im 29/44]
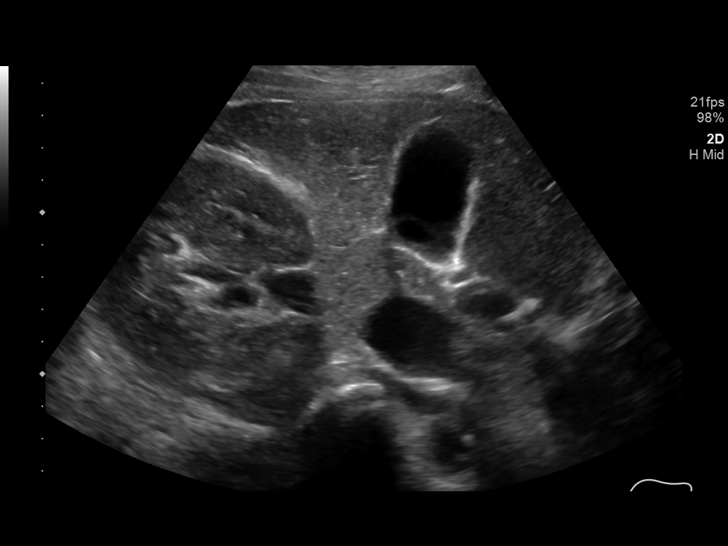
[im 33/44]
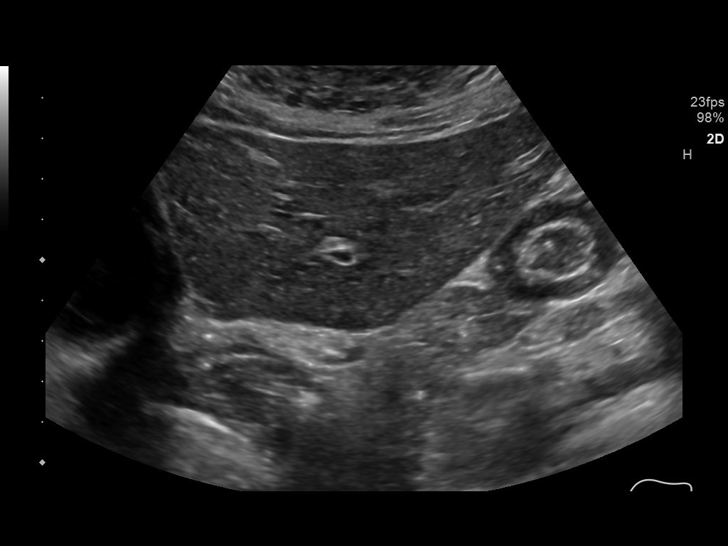
[im 36/44]
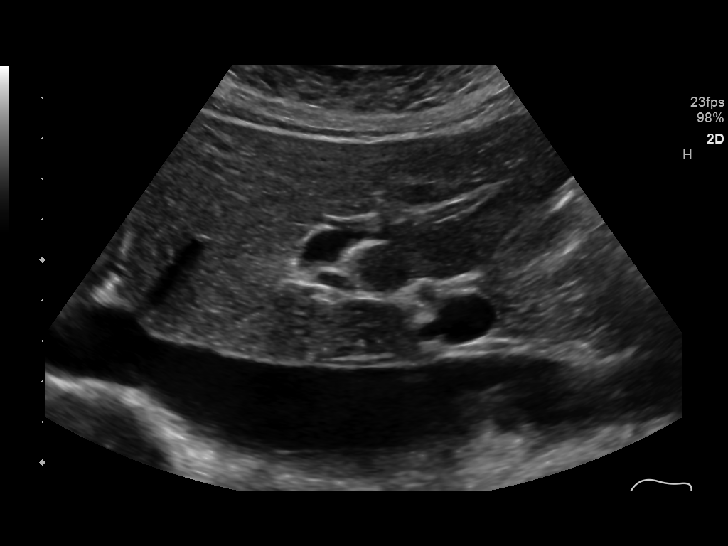
[im 40/44]
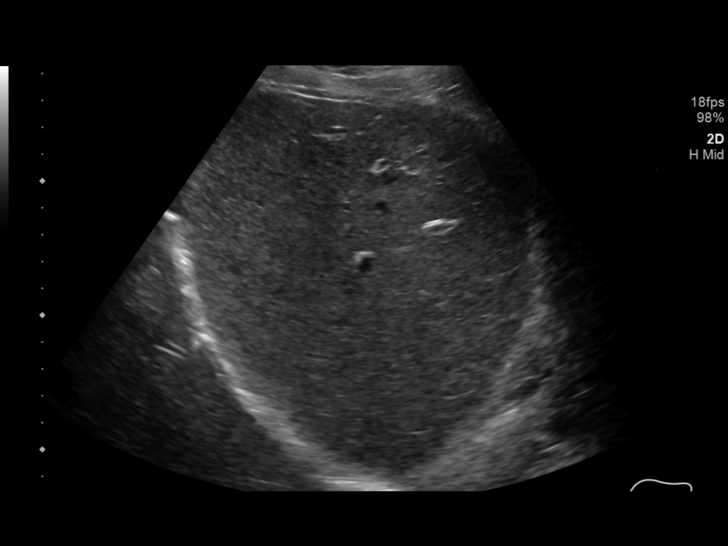
[im 44/44]
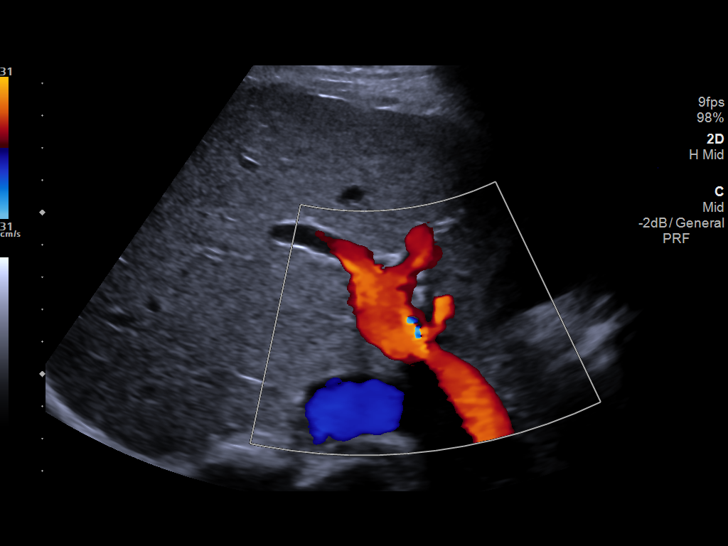

[14 of 25 positions shown; findings below may reference images not displayed]

FINDINGS: Gallbladder:

No gallstones or wall thickening visualized. There is no
pericholecystic fluid. No sonographic Murphy sign noted by
sonographer.

Common bile duct:

Diameter: 4 mm. No intrahepatic or extrahepatic biliary duct
dilatation.

Liver:

No focal lesion identified. Within normal limits in parenchymal
echogenicity. Portal vein is patent on color Doppler imaging with
normal direction of blood flow towards the liver.
IMPRESSION: Study within normal limits.

## 2019-04-15 ENCOUNTER — Other Ambulatory Visit: Payer: Self-pay | Admitting: Obstetrics and Gynecology

## 2019-04-15 DIAGNOSIS — Z3041 Encounter for surveillance of contraceptive pills: Secondary | ICD-10-CM

## 2019-04-15 MED ORDER — NORETHINDRONE 0.35 MG PO TABS: 1.0000 | ORAL_TABLET | Freq: Every day | ORAL | 5 refills | Status: DC

## 2019-04-15 NOTE — Progress Notes (Signed)
MC message to patient Rx sent for 5 months, but needs to contact office prior to that to switch to COC's.  Laury Deep, CNM

## 2019-04-21 ENCOUNTER — Ambulatory Visit: Payer: 59

## 2019-04-21 ENCOUNTER — Other Ambulatory Visit: Payer: Self-pay | Admitting: Obstetrics and Gynecology

## 2019-04-21 DIAGNOSIS — B3731 Acute candidiasis of vulva and vagina: Secondary | ICD-10-CM

## 2019-04-21 DIAGNOSIS — B373 Candidiasis of vulva and vagina: Secondary | ICD-10-CM

## 2019-05-14 ENCOUNTER — Other Ambulatory Visit: Payer: Self-pay

## 2019-05-14 DIAGNOSIS — Z20822 Contact with and (suspected) exposure to covid-19: Secondary | ICD-10-CM

## 2019-05-15 LAB — NOVEL CORONAVIRUS, NAA: SARS-CoV-2, NAA: NOT DETECTED

## 2019-05-19 IMAGING — US US MFM OB FOLLOW-UP
1 series · 13 of 28 positions shown · non-contrast
Comparison: none

[Series 1: us mfm ob follow-up · 13 of 68 slices shown]
[im 3/68]
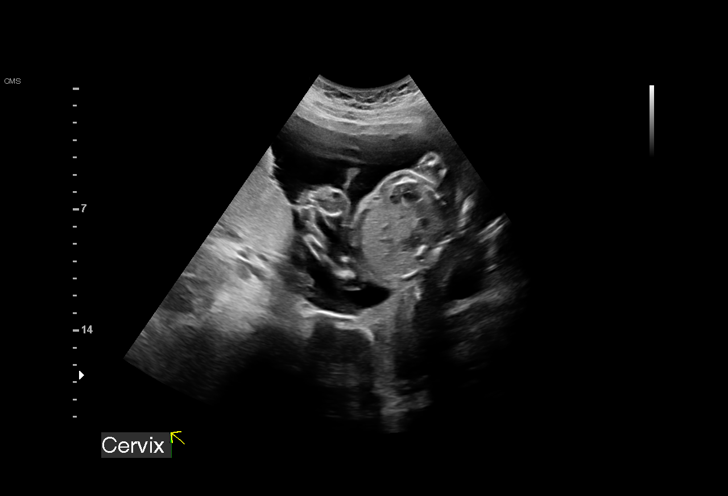
[im 8/68]
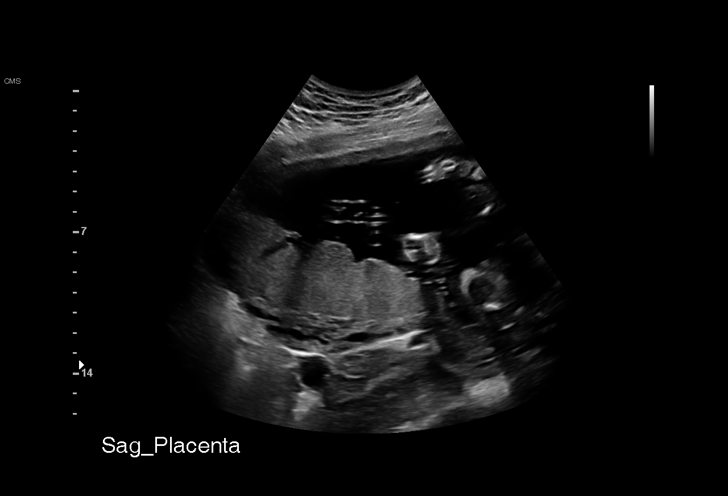
[im 13/68]
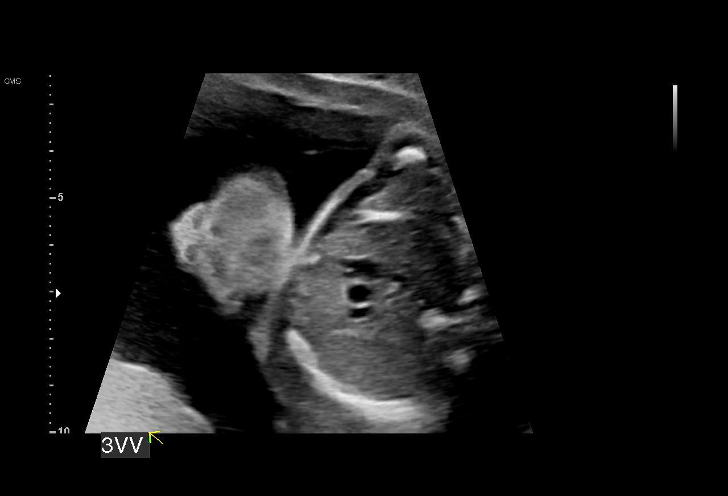
[im 18/68]
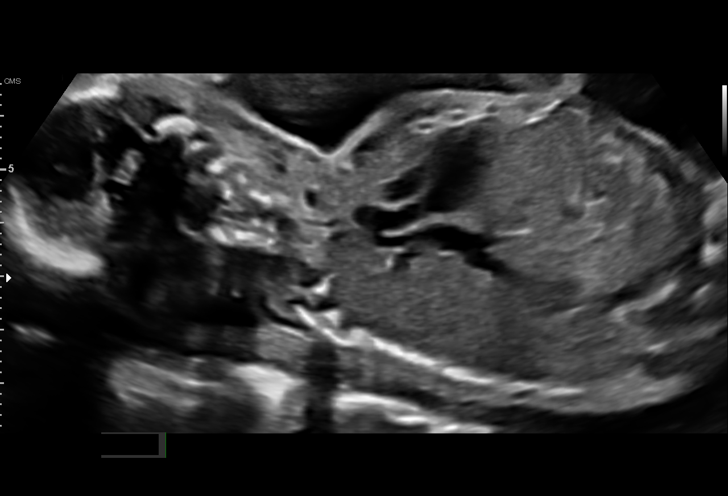
[im 23/68]
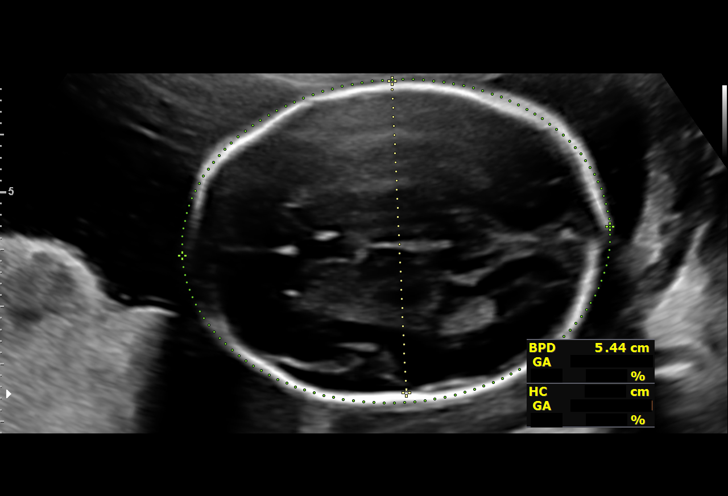
[im 28/68]
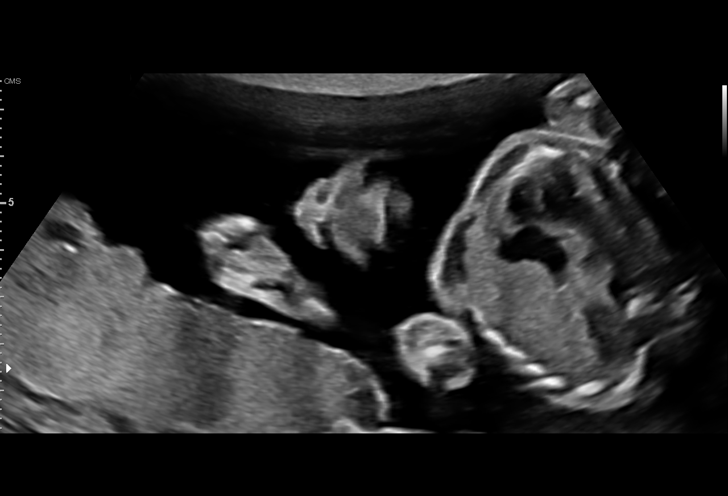
[im 35/68]
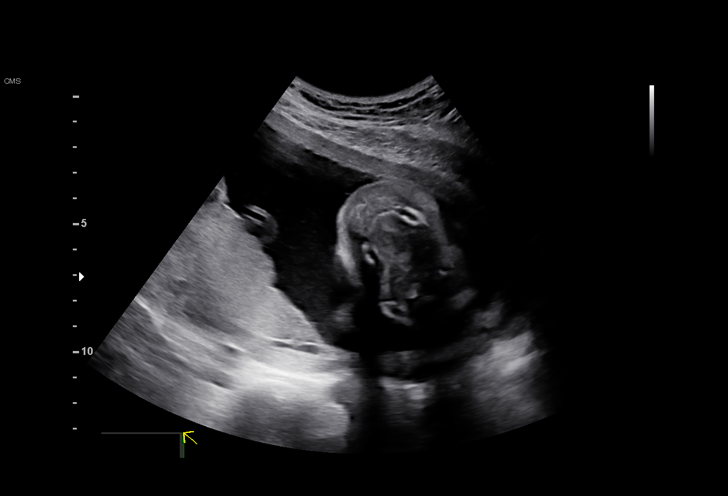
[im 40/68]
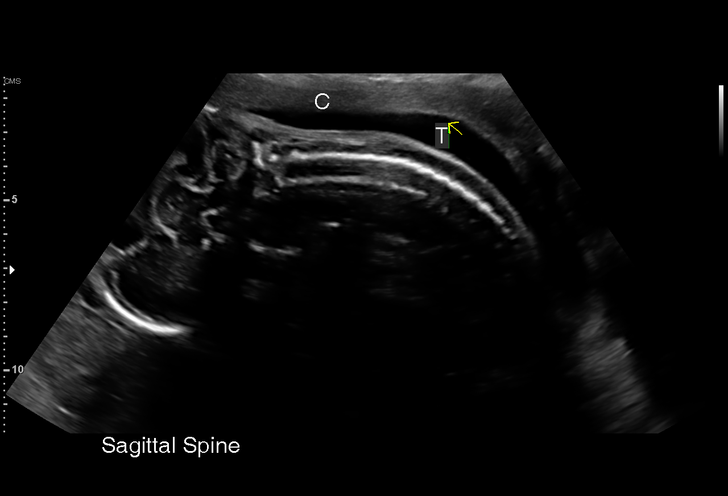
[im 45/68]
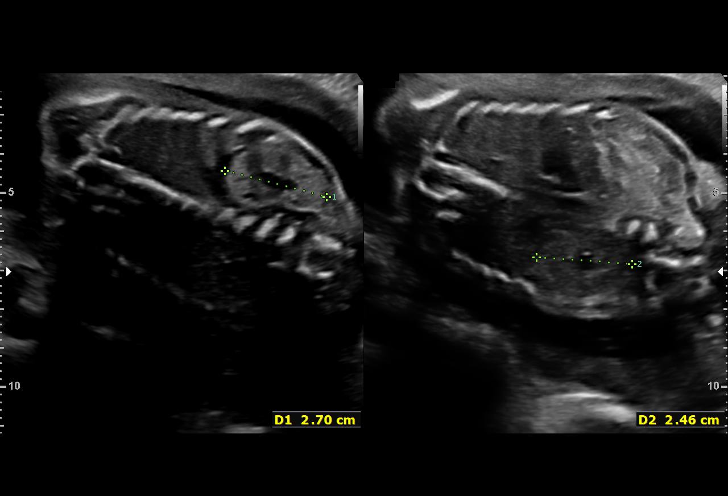
[im 50/68]
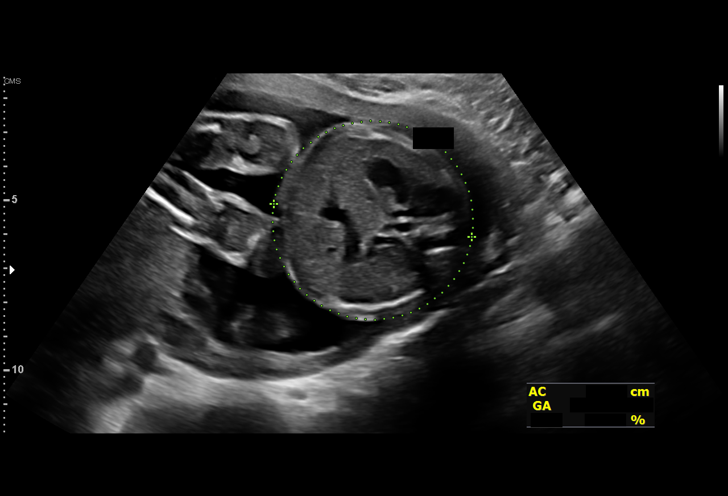
[im 55/68]
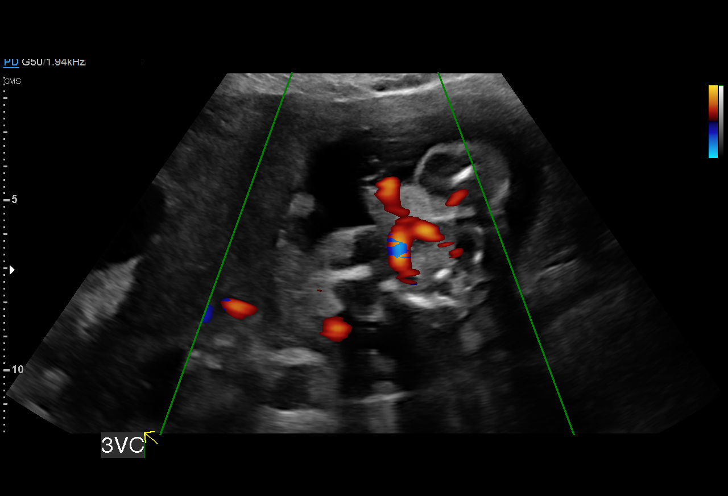
[im 60/68]
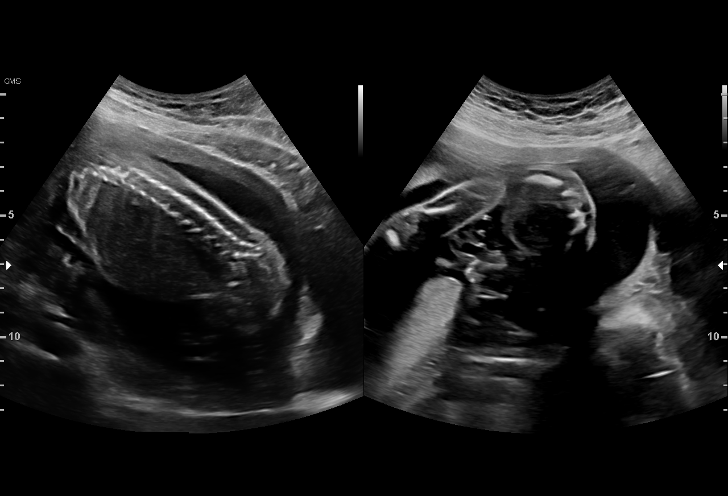
[im 65/68]
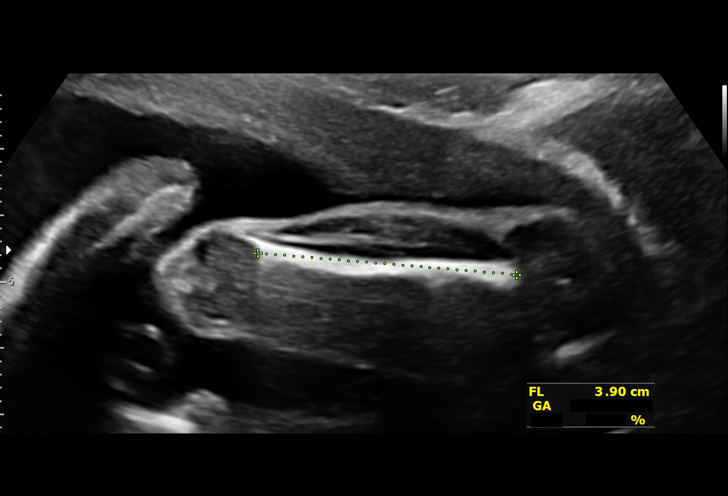

[13 of 28 positions shown; findings below may reference images not displayed]

[REDACTED]care

                                                       [place]
 ----------------------------------------------------------------------

 ----------------------------------------------------------------------
Indications

  Antenatal follow-up for nonvisualized fetal
  anatomy
  23 weeks gestation of pregnancy
 ----------------------------------------------------------------------
Fetal Evaluation

 Num Of Fetuses:          1
 Fetal Heart Rate(bpm):   149
 Cardiac Activity:        Observed
 Presentation:            Variable
 Placenta:                Posterior
 P. Cord Insertion:       Visualized

 Amniotic Fluid
 AFI FV:      Within normal limits

                             Largest Pocket(cm)

Biometry

 BPD:      54.7  mm     G. Age:  22w 5d         22  %    CI:        70.67   %    70 - 86
                                                         FL/HC:       18.8  %    19.2 -
 HC:      207.4  mm     G. Age:  22w 6d         20  %    HC/AC:       1.14       1.05 -
 AC:      182.5  mm     G. Age:  23w 0d         35  %    FL/BPD:      71.3  %    71 - 87
 FL:         39  mm     G. Age:  22w 4d         18  %    FL/AC:       21.4  %    20 - 24
 HUM:      36.4  mm     G. Age:  22w 5d         29  %

 Est. FW:     538   gm     1 lb 3 oz     43  %
OB History
 Gravidity:    4         Term:   2        Prem:   0        SAB:   1
 TOP:          0       Ectopic:  0        Living: 2
Gestational Age

 LMP:           23w 2d        Date:  04/20/18                 EDD:   01/25/19
 U/S Today:     22w 6d                                        EDD:   01/28/19
 Best:          23w 2d     Det. By:  LMP  (04/20/18)          EDD:   01/25/19
Anatomy

 Cranium:               Appears normal         LVOT:                   Appears normal
 Cavum:                 Appears normal         Aortic Arch:            Appears normal
 Ventricles:            Appears normal         Ductal Arch:            Appears normal
 Choroid Plexus:        Appears normal         Diaphragm:              Appears normal
 Cerebellum:            Previously seen        Stomach:                Appears normal, left
                                                                       sided
 Posterior Fossa:       Previously seen        Abdomen:                Appears normal
 Nuchal Fold:           Not applicable (>20    Abdominal Wall:         Appears nml (cord
                        wks GA)                                        insert, abd wall)
 Face:                  Appears normal         Cord Vessels:           Previously seen
                        (orbits and profile)
 Lips:                  Appears normal         Kidneys:                Appear normal
 Palate:                Appears normal         Bladder:                Appears normal
 Thoracic:              Appears normal         Spine:                  Appears normal
 Heart:                 Appears normal         Upper Extremities:      Previously seen
                        (4CH, axis, and
                        situs)
 RVOT:                  Appears normal         Lower Extremities:      Previously seen

 Other:  Nasal bone visualized. Heels and 5th digit previously visualized.
Cervix Uterus Adnexa

 Cervix
 Length:              5  cm.
 Normal appearance by transabdominal scan.

 Uterus
 No abnormality visualized.

 Left Ovary
 Not visualized.

 Right Ovary
 Not visualized.

 Cul De Sac
 No free fluid seen.

 Adnexa
 No abnormality visualized.
Impression

 Normal interval growth.
 Anatomy completed today.
Recommendations
 Follow up as clinically indicated.

## 2019-07-07 ENCOUNTER — Other Ambulatory Visit: Payer: Self-pay

## 2019-07-07 DIAGNOSIS — Z20822 Contact with and (suspected) exposure to covid-19: Secondary | ICD-10-CM

## 2019-07-08 LAB — NOVEL CORONAVIRUS, NAA: SARS-CoV-2, NAA: DETECTED — AB

## 2019-07-09 ENCOUNTER — Encounter: Payer: Self-pay | Admitting: *Deleted

## 2019-07-11 ENCOUNTER — Encounter: Payer: Self-pay | Admitting: *Deleted

## 2019-07-11 ENCOUNTER — Telehealth: Payer: Self-pay | Admitting: General Practice

## 2019-07-11 NOTE — Telephone Encounter (Signed)
COVID testing letter sent via MyChart as requested.  

## 2019-07-11 NOTE — Telephone Encounter (Signed)
Copied from [place] [id number]. Topic: General - Other >> Jul 11, 2019 10:59 AM Erick Blinks wrote: Pt called requesting a nurse's note that states the alloted time she is supposed to quarantine, she needs this document for work purposes. She is requesting that this document be uploaded to her MyChart. Please advise

## 2020-01-12 ENCOUNTER — Other Ambulatory Visit: Payer: Self-pay | Admitting: Obstetrics and Gynecology

## 2020-01-12 DIAGNOSIS — Z3041 Encounter for surveillance of contraceptive pills: Secondary | ICD-10-CM

## 2020-02-06 ENCOUNTER — Ambulatory Visit: Payer: Medicaid Other | Admitting: Advanced Practice Midwife

## 2020-02-11 ENCOUNTER — Encounter: Payer: Self-pay | Admitting: General Practice

## 2020-06-17 ENCOUNTER — Ambulatory Visit: Payer: Medicaid Other | Admitting: Obstetrics and Gynecology

## 2020-06-19 ENCOUNTER — Ambulatory Visit: Payer: Self-pay

## 2020-06-19 ENCOUNTER — Ambulatory Visit
Admission: EM | Admit: 2020-06-19 | Discharge: 2020-06-19 | Disposition: A | Discharge status: Home / Self Care | Payer: Medicaid Other | Attending: Emergency Medicine | Admitting: Emergency Medicine

## 2020-06-19 ENCOUNTER — Encounter: Payer: Self-pay | Admitting: Emergency Medicine

## 2020-06-19 ENCOUNTER — Other Ambulatory Visit: Payer: Self-pay

## 2020-06-19 DIAGNOSIS — B3731 Acute candidiasis of vulva and vagina: Secondary | ICD-10-CM

## 2020-06-19 DIAGNOSIS — R300 Dysuria: Secondary | ICD-10-CM | POA: Diagnosis not present

## 2020-06-19 DIAGNOSIS — B373 Candidiasis of vulva and vagina: Secondary | ICD-10-CM | POA: Insufficient documentation

## 2020-06-19 DIAGNOSIS — R3915 Urgency of urination: Secondary | ICD-10-CM | POA: Diagnosis not present

## 2020-06-19 LAB — POCT URINALYSIS DIP (MANUAL ENTRY)
Bilirubin, UA: NEGATIVE
Glucose, UA: NEGATIVE mg/dL
Ketones, POC UA: NEGATIVE mg/dL
Leukocytes, UA: NEGATIVE
Nitrite, UA: NEGATIVE
Protein Ur, POC: NEGATIVE mg/dL
Spec Grav, UA: 1.02 (ref 1.010–1.025)
Urobilinogen, UA: 0.2 E.U./dL
pH, UA: 6 (ref 5.0–8.0)

## 2020-06-19 MED ORDER — FLUCONAZOLE 200 MG PO TABS: 200.0000 mg | ORAL_TABLET | Freq: Once | ORAL | 0 refills | Status: AC

## 2020-06-19 NOTE — Discharge Instructions (Addendum)
Urine culture sent.  We will call you with the results.   Push fluids and get plenty of rest.   Follow up with PCP if symptoms persists Return here or go to ER if you have any new or worsening symptoms such as fever, worsening abdominal pain, nausea/vomiting, flank pain, etc... 

## 2020-06-19 NOTE — ED Provider Notes (Addendum)
[facility]   Chief Complaint  Patient presents with  . Urinary Tract Infection     SUBJECTIVE:  Becky Cochran is a 29 y.o. female presented to the urgent care with a complaint of dysuria and urgency for the past 1 to 2 weeks.  Patient denies a precipitating event, recent sexual encounter, excessive caffeine intake.  Has tried OTC medications without relief.  Symptoms are made worse with urination.  Admits to similar symptoms in the past.  Denies fever, chills, nausea, vomiting, abdominal pain, flank pain, abnormal vaginal discharge or bleeding, hematuria.    LMP: Patient's last menstrual period was 06/13/2020.  ROS: As in HPI.  All other pertinent ROS negative.     Past Medical History:  Diagnosis Date  . Medical history non-contributory    Past Surgical History:  Procedure Laterality Date  . NO PAST SURGERIES     No Known Allergies No current facility-administered medications on file prior to encounter.   Current Outpatient Medications on File Prior to Encounter  Medication Sig Dispense Refill  . ibuprofen (ADVIL) 800 MG tablet Take 1 tablet (800 mg total) by mouth 3 (three) times daily. 30 tablet 0  . NORLYDA 0.35 MG tablet TAKE 1 TABLET(0.35 MG) BY MOUTH DAILY 28 tablet 3  . Prenatal Vit-Fe Phos-FA-Omega (VITAFOL GUMMIES) 3.33-0.333-34.8 MG CHEW Chew 3 each by mouth daily. 90 tablet 12   Social History   Socioeconomic History  . Marital status: Single    Spouse name: Madison  . Number of children: 2  . Years of education: current [employer]  . Highest education level: Some college, no degree  Occupational History  . Not on file  Tobacco Use  . Smoking status: Never Smoker  . Smokeless tobacco: Never Used  Vaping Use  . Vaping Use: Never used  Substance and Sexual Activity  . Alcohol use: Not Currently    Comment: socially  . Drug use: Never  . Sexual activity: Yes    Birth control/protection: None    Comment: last IC-last pm  Other Topics  Concern  . Not on file  Social History Narrative  . Not on file   Social Determinants of Health   Financial Resource Strain:   . Difficulty of Paying Living Expenses: Not on file  Food Insecurity:   . Worried About Programme researcher, broadcasting/film/video in the Last Year: Not on file  . Ran Out of Food in the Last Year: Not on file  Transportation Needs:   . Lack of Transportation (Medical): Not on file  . Lack of Transportation (Non-Medical): Not on file  Physical Activity:   . Days of Exercise per Week: Not on file  . Minutes of Exercise per Session: Not on file  Stress:   . Feeling of Stress : Not on file  Social Connections:   . Frequency of Communication with Friends and Family: Not on file  . Frequency of Social Gatherings with Friends and Family: Not on file  . Attends Religious Services: Not on file  . Active Member of Clubs or Organizations: Not on file  . Attends Banker Meetings: Not on file  . Marital Status: Not on file  Intimate Partner Violence:   . Fear of Current or Ex-Partner: Not on file  . Emotionally Abused: Not on file  . Physically Abused: Not on file  . Sexually Abused: Not on file   Family History  Problem Relation Age of Onset  . Diabetes Mother   . Heart  disease Mother   . Kidney disease Mother   . Hypertension Mother   . Cancer Maternal Grandmother   . Cancer Paternal Grandmother     OBJECTIVE:  Vitals:   06/19/20 1433  BP: 118/78  Pulse: (!) 56  Resp: 16  Temp: 98.5 F (36.9 C)  SpO2: 98%   General appearance: AOx3 in no acute distress HEENT: NCAT.  Oropharynx clear.  Lungs: clear to auscultation bilaterally without adventitious breath sounds Heart: regular rate and rhythm.  Radial pulses 2+ symmetrical bilaterally Abdomen: soft; non-distended; no tenderness; bowel sounds present; no guarding or rebound tenderness Back: no CVA tenderness Extremities: no edema; symmetrical with no gross deformities Skin: warm and dry GU: Deferred,  Cervical self swab was obtained Neurologic: Ambulates from chair to exam table without difficulty Psychological: alert and cooperative; normal mood and affect  Labs Reviewed  POCT URINALYSIS DIP (MANUAL ENTRY) - Abnormal; Notable for the following components:      Result Value   Blood, UA small (*)    All other components within normal limits  URINE CULTURE  CERVICOVAGINAL ANCILLARY ONLY    ASSESSMENT & PLAN:  1. Dysuria   2. Urinary urgency   3. Vaginal yeast infection     Meds ordered this encounter  Medications  . fluconazole (DIFLUCAN) 200 MG tablet    Sig: Take 1 tablet (200 mg total) by mouth once for 1 dose. Take the second dose 72 hours after the first if symptom does not resolve    Dispense:  2 tablet    Refill:  0   Discharge Instructions  Urine culture sent.  We will call you with the results.   Push fluids and get plenty of rest.   Follow up with PCP if symptoms persists Return here or go to ER if you have any new or worsening symptoms such as fever, worsening abdominal pain, nausea/vomiting, flank pain, etc...  Outlined signs and symptoms indicating need for more acute intervention. Patient verbalized understanding. After Visit Summary given.     Durward Parcel, FNP 06/19/20 1509    Durward Parcel, FNP 06/19/20 1513

## 2020-06-19 NOTE — ED Triage Notes (Signed)
Patient states that she has uti symptoms, has constant bv, urgency to urinate x 1-2 weeks. recently has her period.

## 2020-06-20 LAB — URINE CULTURE: Culture: NO GROWTH

## 2020-06-21 LAB — CERVICOVAGINAL ANCILLARY ONLY
Bacterial Vaginitis (gardnerella): POSITIVE — AB
Candida Glabrata: NEGATIVE
Candida Vaginitis: POSITIVE — AB
Chlamydia: NEGATIVE
Comment: NEGATIVE
Comment: NEGATIVE
Comment: NEGATIVE
Comment: NEGATIVE
Comment: NEGATIVE
Comment: NORMAL
Neisseria Gonorrhea: NEGATIVE
Trichomonas: NEGATIVE

## 2020-06-22 ENCOUNTER — Telehealth (HOSPITAL_COMMUNITY): Payer: Self-pay | Admitting: Emergency Medicine

## 2020-06-22 MED ORDER — METRONIDAZOLE 500 MG PO TABS: 500.0000 mg | ORAL_TABLET | Freq: Two times a day (BID) | ORAL | 0 refills | Status: DC

## 2020-07-03 ENCOUNTER — Ambulatory Visit
Admission: EM | Admit: 2020-07-03 | Discharge: 2020-07-03 | Disposition: A | Discharge status: Home / Self Care | Payer: Medicaid Other | Attending: Emergency Medicine | Admitting: Emergency Medicine

## 2020-07-03 ENCOUNTER — Other Ambulatory Visit: Payer: Self-pay

## 2020-07-03 ENCOUNTER — Ambulatory Visit: Payer: Self-pay

## 2020-07-03 ENCOUNTER — Encounter: Payer: Self-pay | Admitting: Emergency Medicine

## 2020-07-03 DIAGNOSIS — Z1152 Encounter for screening for COVID-19: Secondary | ICD-10-CM | POA: Diagnosis not present

## 2020-07-03 DIAGNOSIS — J029 Acute pharyngitis, unspecified: Secondary | ICD-10-CM | POA: Diagnosis present

## 2020-07-03 DIAGNOSIS — R059 Cough, unspecified: Secondary | ICD-10-CM | POA: Diagnosis not present

## 2020-07-03 DIAGNOSIS — J069 Acute upper respiratory infection, unspecified: Secondary | ICD-10-CM

## 2020-07-03 LAB — POCT RAPID STREP A (OFFICE): Rapid Strep A Screen: NEGATIVE

## 2020-07-03 MED ORDER — DEXAMETHASONE 4 MG PO TABS: 4.0000 mg | ORAL_TABLET | Freq: Every day | ORAL | 0 refills | Status: AC

## 2020-07-03 MED ORDER — BENZONATATE 100 MG PO CAPS: 100.0000 mg | ORAL_CAPSULE | Freq: Three times a day (TID) | ORAL | 0 refills | Status: AC

## 2020-07-03 MED ORDER — CETIRIZINE HCL 10 MG PO TABS: 10.0000 mg | ORAL_TABLET | Freq: Every day | ORAL | 0 refills | Status: DC

## 2020-07-03 NOTE — ED Triage Notes (Signed)
Cough and sore throat for past few days  

## 2020-07-03 NOTE — ED Provider Notes (Signed)
[facility]   [id number] 07/03/20 Arrival Time: 1215   CC: COVID symptoms  SUBJECTIVE: History from: patient and family.  Becky Cochran is a 29 y.o. female who presented to the urgent care for complaint of cough, nasal congestion and sore throat for the past few days.  Denies sick exposure to COVID, flu or strep.  Denies recent travel.  Has tried OTC medication without relief.  Denies aggravating factors.  Denies previous symptoms in the past.   Denies fever, chills, fatigue, sinus pain, rhinorrhea, sore throat, SOB, wheezing, chest pain, nausea, changes in bowel or bladder habits.    ROS: As per HPI.  All other pertinent ROS negative.      Past Medical History:  Diagnosis Date  . Medical history non-contributory    Past Surgical History:  Procedure Laterality Date  . NO PAST SURGERIES     No Known Allergies No current facility-administered medications on file prior to encounter.   Current Outpatient Medications on File Prior to Encounter  Medication Sig Dispense Refill  . ibuprofen (ADVIL) 800 MG tablet Take 1 tablet (800 mg total) by mouth 3 (three) times daily. 30 tablet 0  . metroNIDAZOLE (FLAGYL) 500 MG tablet Take 1 tablet (500 mg total) by mouth 2 (two) times daily. 14 tablet 0  . NORLYDA 0.35 MG tablet TAKE 1 TABLET(0.35 MG) BY MOUTH DAILY 28 tablet 3  . Prenatal Vit-Fe Phos-FA-Omega (VITAFOL GUMMIES) 3.33-0.333-34.8 MG CHEW Chew 3 each by mouth daily. 90 tablet 12   Social History   Socioeconomic History  . Marital status: Single    Spouse name: Madison  . Number of children: 2  . Years of education: current [employer]  . Highest education level: Some college, no degree  Occupational History  . Not on file  Tobacco Use  . Smoking status: Never Smoker  . Smokeless tobacco: Never Used  Vaping Use  . Vaping Use: Never used  Substance and Sexual Activity  . Alcohol use: Not Currently    Comment: socially  . Drug use: Never  . Sexual activity: Yes      Birth control/protection: None    Comment: last IC-last pm  Other Topics Concern  . Not on file  Social History Narrative  . Not on file   Social Determinants of Health   Financial Resource Strain:   . Difficulty of Paying Living Expenses: Not on file  Food Insecurity:   . Worried About Programme researcher, broadcasting/film/video in the Last Year: Not on file  . Ran Out of Food in the Last Year: Not on file  Transportation Needs:   . Lack of Transportation (Medical): Not on file  . Lack of Transportation (Non-Medical): Not on file  Physical Activity:   . Days of Exercise per Week: Not on file  . Minutes of Exercise per Session: Not on file  Stress:   . Feeling of Stress : Not on file  Social Connections:   . Frequency of Communication with Friends and Family: Not on file  . Frequency of Social Gatherings with Friends and Family: Not on file  . Attends Religious Services: Not on file  . Active Member of Clubs or Organizations: Not on file  . Attends Banker Meetings: Not on file  . Marital Status: Not on file  Intimate Partner Violence:   . Fear of Current or Ex-Partner: Not on file  . Emotionally Abused: Not on file  . Physically Abused: Not on file  . Sexually Abused:  Not on file   Family History  Problem Relation Age of Onset  . Diabetes Mother   . Heart disease Mother   . Kidney disease Mother   . Hypertension Mother   . Cancer Maternal Grandmother   . Cancer Paternal Grandmother     OBJECTIVE:  Vitals:   07/03/20 1327  BP: 136/83  Pulse: 90  Resp: 18  Temp: 98.9 F (37.2 C)  SpO2: 97%     General appearance: alert; appears fatigued, but nontoxic; speaking in full sentences and tolerating own secretions HEENT: NCAT; Ears: EACs clear, TMs pearly gray; Eyes: PERRL.  EOM grossly intact. Sinuses: nontender; Nose: nares patent without rhinorrhea, Throat: oropharynx clear, tonsils non erythematous or enlarged, uvula midline  Neck: supple without LAD Lungs: unlabored  respirations, symmetrical air entry; cough: moderate; no respiratory distress; CTAB Heart: regular rate and rhythm.  Radial pulses 2+ symmetrical bilaterally Skin: warm and dry Psychological: alert and cooperative; normal mood and affect  LABS:  Results for orders placed or performed during the hospital encounter of 07/03/20 (from the past 24 hour(s))  POCT rapid strep A     Status: None   Collection Time: 07/03/20  1:41 PM  Result Value Ref Range   Rapid Strep A Screen Negative Negative     ASSESSMENT & PLAN:  1. URI with cough and congestion   2. Sore throat   3. Encounter for screening for COVID-19     Meds ordered this encounter  Medications  . benzonatate (TESSALON) 100 MG capsule    Sig: Take 1 capsule (100 mg total) by mouth every 8 (eight) hours.    Dispense:  30 capsule    Refill:  0  . cetirizine (ZYRTEC ALLERGY) 10 MG tablet    Sig: Take 1 tablet (10 mg total) by mouth daily.    Dispense:  30 tablet    Refill:  0  . dexamethasone (DECADRON) 4 MG tablet    Sig: Take 1 tablet (4 mg total) by mouth daily for 7 days.    Dispense:  7 tablet    Refill:  0    Discharge instructions  Strep test is negative.  Sample will be sent for culture and someone will call you if your result is abnormal  COVID-19, flu A/B, RSV testing ordered.  It will take between 2-7 days for test results.  Someone will contact you regarding abnormal results.    In the meantime: You should remain isolated in your home for 10 days from symptom onset AND greater than 24 hours after symptoms resolution (absence of fever without the use of fever-reducing medication and improvement in respiratory symptoms), whichever is longer Get plenty of rest and push fluids Tessalon Perles prescribed for cough Zyrtec for nasal congestion, runny nose, and/or sore throat Continue flonase for nasal congestion and runny nose Decadron was prescribed Use medications daily for symptom relief Use OTC medications  like ibuprofen or tylenol as needed fever or pain Call or go to the ED if you have any new or worsening symptoms such as fever, worsening cough, shortness of breath, chest tightness, chest pain, turning blue, changes in mental status, etc...   Reviewed expectations re: course of current medical issues. Questions answered. Outlined signs and symptoms indicating need for more acute intervention. Patient verbalized understanding. After Visit Summary given.         Durward Parcel, FNP 07/03/20 1404

## 2020-07-03 NOTE — Discharge Instructions (Signed)
Strep test is negative.  Sample will be sent for culture and someone will call you if your result is abnormal  COVID-19, flu A/B, RSV testing ordered.  It will take between 2-7 days for test results.  Someone will contact you regarding abnormal results.    In the meantime: You should remain isolated in your home for 10 days from symptom onset AND greater than 24 hours after symptoms resolution (absence of fever without the use of fever-reducing medication and improvement in respiratory symptoms), whichever is longer Get plenty of rest and push fluids Tessalon Perles prescribed for cough Zyrtec for nasal congestion, runny nose, and/or sore throat Continue flonase for nasal congestion and runny nose Decadron was prescribed Use medications daily for symptom relief Use OTC medications like ibuprofen or tylenol as needed fever or pain Call or go to the ED if you have any new or worsening symptoms such as fever, worsening cough, shortness of breath, chest tightness, chest pain, turning blue, changes in mental status, etc..Marland Kitchen

## 2020-07-06 LAB — CULTURE, GROUP A STREP (THRC)

## 2020-07-06 LAB — COVID-19, FLU A+B AND RSV
Influenza A, NAA: NOT DETECTED
Influenza B, NAA: NOT DETECTED
RSV, NAA: NOT DETECTED
SARS-CoV-2, NAA: NOT DETECTED

## 2021-12-04 ENCOUNTER — Other Ambulatory Visit: Payer: Self-pay

## 2021-12-04 ENCOUNTER — Encounter (HOSPITAL_BASED_OUTPATIENT_CLINIC_OR_DEPARTMENT_OTHER): Payer: Self-pay

## 2021-12-04 ENCOUNTER — Emergency Department (HOSPITAL_BASED_OUTPATIENT_CLINIC_OR_DEPARTMENT_OTHER): Payer: Medicaid Other | Admitting: Radiology

## 2021-12-04 ENCOUNTER — Ambulatory Visit: Admit: 2021-12-04 | Discharge status: Against Medical Advice | Payer: Medicaid Other

## 2021-12-04 ENCOUNTER — Emergency Department (HOSPITAL_BASED_OUTPATIENT_CLINIC_OR_DEPARTMENT_OTHER)
Admission: EM | Admit: 2021-12-04 | Discharge: 2021-12-04 | Disposition: A | Discharge status: Home / Self Care | Payer: Medicaid Other | Attending: Emergency Medicine | Admitting: Emergency Medicine

## 2021-12-04 DIAGNOSIS — M546 Pain in thoracic spine: Secondary | ICD-10-CM | POA: Diagnosis not present

## 2021-12-04 DIAGNOSIS — M549 Dorsalgia, unspecified: Secondary | ICD-10-CM

## 2021-12-04 NOTE — ED Provider Notes (Signed)
?[facility] ?Provider Note ? ? ?CSN: [id number] ?Arrival date & time: 12/04/21  1500 ? ?  ? ?History ? ?Chief Complaint  ?Patient presents with  ? Back Pain  ? ? ?Kenza Munar is a 31 y.o. female. ? ?Patient presents chief complaint of left upper back pain.  She states has been there for about 2 days now.  Denies any specific fall or trauma that she can recall.  Pain is worse when she reaches over or twists her torso a certain way or when she takes a deep breath she feels a sharp pain on the left lateral side.  Denies fevers denies cough denies vomiting denies diarrhea.  Denies any pain at rest, only with certain specific activities as mentioned. ? ? ?  ? ?Home Medications ?Prior to Admission medications   ?Medication Sig Start Date End Date Taking? Authorizing Provider  ?benzonatate (TESSALON) 100 MG capsule Take 1 capsule (100 mg total) by mouth every 8 (eight) hours. 07/03/20   Durward Parcel, FNP  ?cetirizine (ZYRTEC ALLERGY) 10 MG tablet Take 1 tablet (10 mg total) by mouth daily. 07/03/20   Avegno, Zachery Dakins, FNP  ?ibuprofen (ADVIL) 800 MG tablet Take 1 tablet (800 mg total) by mouth 3 (three) times daily. 01/31/19   Donette Larry, CNM  ?metroNIDAZOLE (FLAGYL) 500 MG tablet Take 1 tablet (500 mg total) by mouth 2 (two) times daily. 06/22/20   Merrilee Jansky, MD  ?NORLYDA 0.35 MG tablet TAKE 1 TABLET(0.35 MG) BY MOUTH DAILY 01/12/20   Raelyn Mora, CNM  ?Prenatal Vit-Fe Phos-FA-Omega (VITAFOL GUMMIES) 3.33-0.333-34.8 MG CHEW Chew 3 each by mouth daily. 07/08/18   Raelyn Mora, CNM  ?   ? ?Allergies    ?Patient has no known allergies.   ? ?Review of Systems   ?Review of Systems  ?Constitutional:  Negative for fever.  ?HENT:  Negative for ear pain.   ?Eyes:  Negative for pain.  ?Respiratory:  Negative for cough.   ?Cardiovascular:  Negative for chest pain.  ?Gastrointestinal:  Negative for abdominal pain.  ?Genitourinary:  Negative for flank pain.  ?Musculoskeletal:   Positive for back pain.  ?Skin:  Negative for rash.  ?Neurological:  Negative for headaches.  ? ?Physical Exam ?Updated Vital Signs ?BP 125/78   Pulse 60   Temp 98.3 ?F (36.8 ?C) (Oral)   Resp 16   Ht 5\' 6"  (1.676 m)   Wt 90.7 kg   LMP 10/12/2021 Comment: abortion in April '23  SpO2 96%   BMI 32.28 kg/m?  ?Physical Exam ?Constitutional:   ?   General: She is not in acute distress. ?   Appearance: Normal appearance.  ?HENT:  ?   Head: Normocephalic.  ?   Nose: Nose normal.  ?Eyes:  ?   Extraocular Movements: Extraocular movements intact.  ?Cardiovascular:  ?   Rate and Rhythm: Normal rate.  ?Pulmonary:  ?   Effort: Pulmonary effort is normal.  ?Musculoskeletal:     ?   General: Normal range of motion.  ?   Cervical back: Normal range of motion.  ?   Comments: Pain reproduced with rotation of the torso to her right.  Tenderness palpation in the left upper back rib section.  No crepitus noted.  No skin changes noted.  ?Neurological:  ?   General: No focal deficit present.  ?   Mental Status: She is alert. Mental status is at baseline.  ? ? ?ED Results / Procedures / Treatments   ?Labs ?(all  labs ordered are listed, but only abnormal results are displayed) ?Labs Reviewed - No data to display ? ?EKG ?None ? ?Radiology ?DG Ribs Unilateral W/Chest Left ? ?Result Date: 12/04/2021 ?CLINICAL DATA:  Left lower posterior rib pain since yesterday. EXAM: LEFT RIBS AND CHEST - 3+ VIEW COMPARISON:  None Available. FINDINGS: Lungs are adequately inflated without focal airspace consolidation, effusion or pneumothorax. Cardiomediastinal silhouette is normal. Minimal curvature of the thoracic spine convex right. No evidence of rib fracture. IMPRESSION: No acute findings. Electronically Signed   By: Elberta Fortis M.D.   On: 12/04/2021 16:18   ? ?Procedures ?Procedures  ? ? ?Medications Ordered in ED ?Medications - No data to display ? ?ED Course/ Medical Decision Making/ A&P ?  ?                        ?Medical Decision  Making ?Amount and/or Complexity of Data Reviewed ?Radiology: ordered. ? ? ?Cardiac monitor shows sinus rhythm. ? ?Chart review shows upper respite infection July 03, 2020. ? ?Maurine Minister states include x-rays of the left-sided ribs and chest.  These are unremarkable. ? ?Differential includes pneumothorax versus pneumonia versus musculoskeletal etiology versus pulmonary embolism.  Feel pulmonary was less likely as the patient has no lower extremity leg swelling or tenderness in the calf.  She has no chest pain or shortness of breath at rest.  Symptoms appear reproduced with physical motion.  She is otherwise PERC negative as well. ? ?Recommending to continue Tylenol Motrin at home.  Advised outpatient follow-up with her doctor in a week.  Advising immediate return for worsening symptoms fevers cough or any additional concerns. ? ? ? ? ? ? ? ?Final Clinical Impression(s) / ED Diagnoses ?Final diagnoses:  ?Upper back pain on left side  ? ? ?Rx / DC Orders ?ED Discharge Orders   ? ? None  ? ?  ? ? ?  ?Cheryll Cockayne, MD ?12/04/21 1648 ? ?

## 2021-12-04 NOTE — Discharge Instructions (Addendum)
Your x-rays were normal appearing.  There is no evidence of pneumonia or collapsed lung. ? ?Continue taking Tylenol Motrin as needed at home.  Follow-up with your doctor in 3 to 4 days.  Return immediately back to the ER if you have worsening symptoms fevers cough or any additional concerns. ?

## 2021-12-04 NOTE — ED Triage Notes (Addendum)
Pt c/o thoracic back pain that is worse with movement and deep breaths. Described as spasms and stabbing pain. Denies injury.  ?

## 2021-12-06 ENCOUNTER — Telehealth: Payer: Medicaid Other | Admitting: Family Medicine

## 2021-12-06 DIAGNOSIS — B3731 Acute candidiasis of vulva and vagina: Secondary | ICD-10-CM

## 2021-12-06 MED ORDER — FLUCONAZOLE 150 MG PO TABS: ORAL_TABLET | ORAL | 0 refills | Status: DC

## 2021-12-06 NOTE — Progress Notes (Signed)
?Virtual Visit Consent  ? ?Becky Cochran, you are scheduled for a virtual visit with a [facility] provider today. Just as with appointments in the office, your consent must be obtained to participate. Your consent will be active for this visit and any virtual visit you may have with one of our providers in the next 365 days. If you have a MyChart account, a copy of this consent can be sent to you electronically. ? ?As this is a virtual visit, video technology does not allow for your provider to perform a traditional examination. This may limit your provider's ability to fully assess your condition. If your provider identifies any concerns that need to be evaluated in person or the need to arrange testing (such as labs, EKG, etc.), we will make arrangements to do so. Although advances in technology are sophisticated, we cannot ensure that it will always work on either your end or our end. If the connection with a video visit is poor, the visit may have to be switched to a telephone visit. With either a video or telephone visit, we are not always able to ensure that we have a secure connection. ? ?By engaging in this virtual visit, you consent to the provision of healthcare and authorize for your insurance to be billed (if applicable) for the services provided during this visit. Depending on your insurance coverage, you may receive a charge related to this service. ? ?I need to obtain your verbal consent now. Are you willing to proceed with your visit today? Becky Cochran has provided verbal consent on 12/06/2021 for a virtual visit (video or telephone). Freddy Finner, NP ? ?Date: 12/06/2021 12:02 PM ? ?Virtual Visit via Video Note  ? ?Becky Cochran, connected with  Becky Cochran  ([id number], 1991/02/03) on 12/06/21 at 12:00 PM EDT by a video-enabled telemedicine application and verified that I am speaking with the correct person using two identifiers. ? ?Location: ?Patient: Virtual Visit Location Patient:  Home ?Provider: Virtual Visit Location Provider: Home Office ?  ?I discussed the limitations of evaluation and management by telemedicine and the availability of in person appointments. The patient expressed understanding and agreed to proceed.   ? ?History of Present Illness: ?Becky Cochran is a 31 y.o. who identifies as a female who was assigned female at birth, and is being seen today for vaginal discharge. ? ?HPI: Vaginal Discharge ?The patient's primary symptoms include vaginal discharge. The patient's pertinent negatives include no genital itching. This is a new problem. The current episode started in the past 7 days. The problem occurs constantly. The problem has been waxing and waning. The patient is experiencing no pain. Pertinent negatives include no abdominal pain, anorexia, back pain, chills, constipation, diarrhea, discolored urine, dysuria, fever, flank pain, frequency, headaches, hematuria, joint pain, joint swelling, nausea, painful intercourse, rash, sore throat, urgency or vomiting. The vaginal discharge was white and thick (cottage cheese look). There has been no bleeding. She has not been passing clots. She has not been passing tissue. Nothing aggravates the symptoms. She has tried nothing for the symptoms. The treatment provided no relief. She is sexually active. No, her partner does not have an STD. She uses oral contraceptives for contraception. Her menstrual history has been regular. Her past medical history is significant for vaginosis.   ?Problems:  ?Patient Active Problem List  ? Diagnosis Date Noted  ? Non-reactive NST (non-stress test) 01/30/2019  ? Post-dates pregnancy 01/29/2019  ? Anemia affecting pregnancy in third trimester 11/13/2018  ?  Supervision of other normal pregnancy, antepartum 07/08/2018  ?  ?Allergies: No Known Allergies ?Medications:  ?Current Outpatient Medications:  ?  benzonatate (TESSALON) 100 MG capsule, Take 1 capsule (100 mg total) by mouth every 8 (eight) hours.,  Disp: 30 capsule, Rfl: 0 ?  cetirizine (ZYRTEC ALLERGY) 10 MG tablet, Take 1 tablet (10 mg total) by mouth daily., Disp: 30 tablet, Rfl: 0 ?  ibuprofen (ADVIL) 800 MG tablet, Take 1 tablet (800 mg total) by mouth 3 (three) times daily., Disp: 30 tablet, Rfl: 0 ?  metroNIDAZOLE (FLAGYL) 500 MG tablet, Take 1 tablet (500 mg total) by mouth 2 (two) times daily., Disp: 14 tablet, Rfl: 0 ?  NORLYDA 0.35 MG tablet, TAKE 1 TABLET(0.35 MG) BY MOUTH DAILY, Disp: 28 tablet, Rfl: 3 ?  Prenatal Vit-Fe Phos-FA-Omega (VITAFOL GUMMIES) 3.33-0.333-34.8 MG CHEW, Chew 3 each by mouth daily., Disp: 90 tablet, Rfl: 12 ? ?Observations/Objective: ?Patient is well-developed, well-nourished in no acute distress.  ?Resting comfortably  at home.  ?Head is normocephalic, atraumatic.  ?No labored breathing.  ?Speech is clear and coherent with logical content.  ?Patient is alert and oriented at baseline.  ? ? ?Assessment and Plan: ?1. Yeast vaginitis ? ?- fluconazole (DIFLUCAN) 150 MG tablet; Take one 150 mg tablet now and repeat in 3 days/.  Dispense: 2 tablet; Refill: 0 ? ?S&S consistent with vaginal yeast ?Treatment as per above ? ? Reviewed side effects, risks and benefits of medication.   ? ?Patient acknowledged agreement and understanding of the plan.  ? ? ? ?Follow Up Instructions: ?I discussed the assessment and treatment plan with the patient. The patient was provided an opportunity to ask questions and all were answered. The patient agreed with the plan and demonstrated an understanding of the instructions.  A copy of instructions were sent to the patient via MyChart unless otherwise noted below.  ? ?The patient was advised to call back or seek an in-person evaluation if the symptoms worsen or if the condition fails to improve as anticipated. ? ?Time:  ?I spent 10 minutes with the patient via telehealth technology discussing the above problems/concerns.   ? ?Freddy Finner, NP ? ?

## 2021-12-06 NOTE — Patient Instructions (Signed)

## 2022-07-23 IMAGING — DX DG RIBS W/ CHEST 3+V*L*
4 series · 4 of 4 positions shown · non-contrast
Comparison: None Available.

CLINICAL DATA: Left lower posterior rib pain since yesterday.

EXAM:
LEFT RIBS AND CHEST - 3+ VIEW

[chest pa]
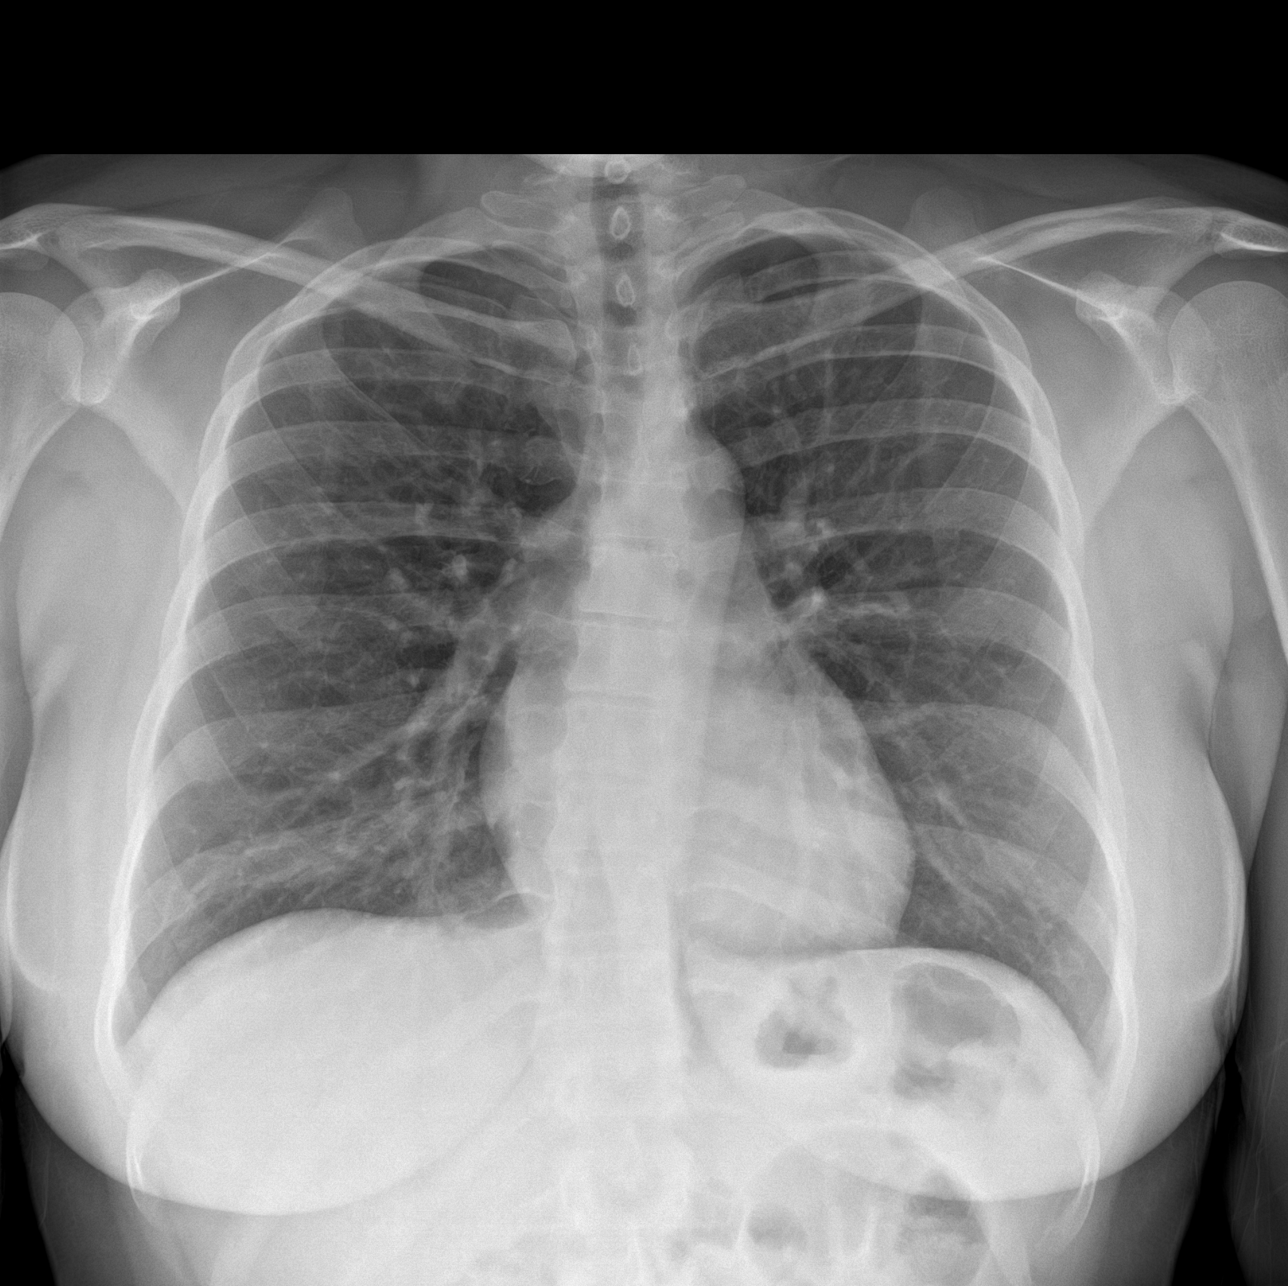

[rib ap (1 of 2)]
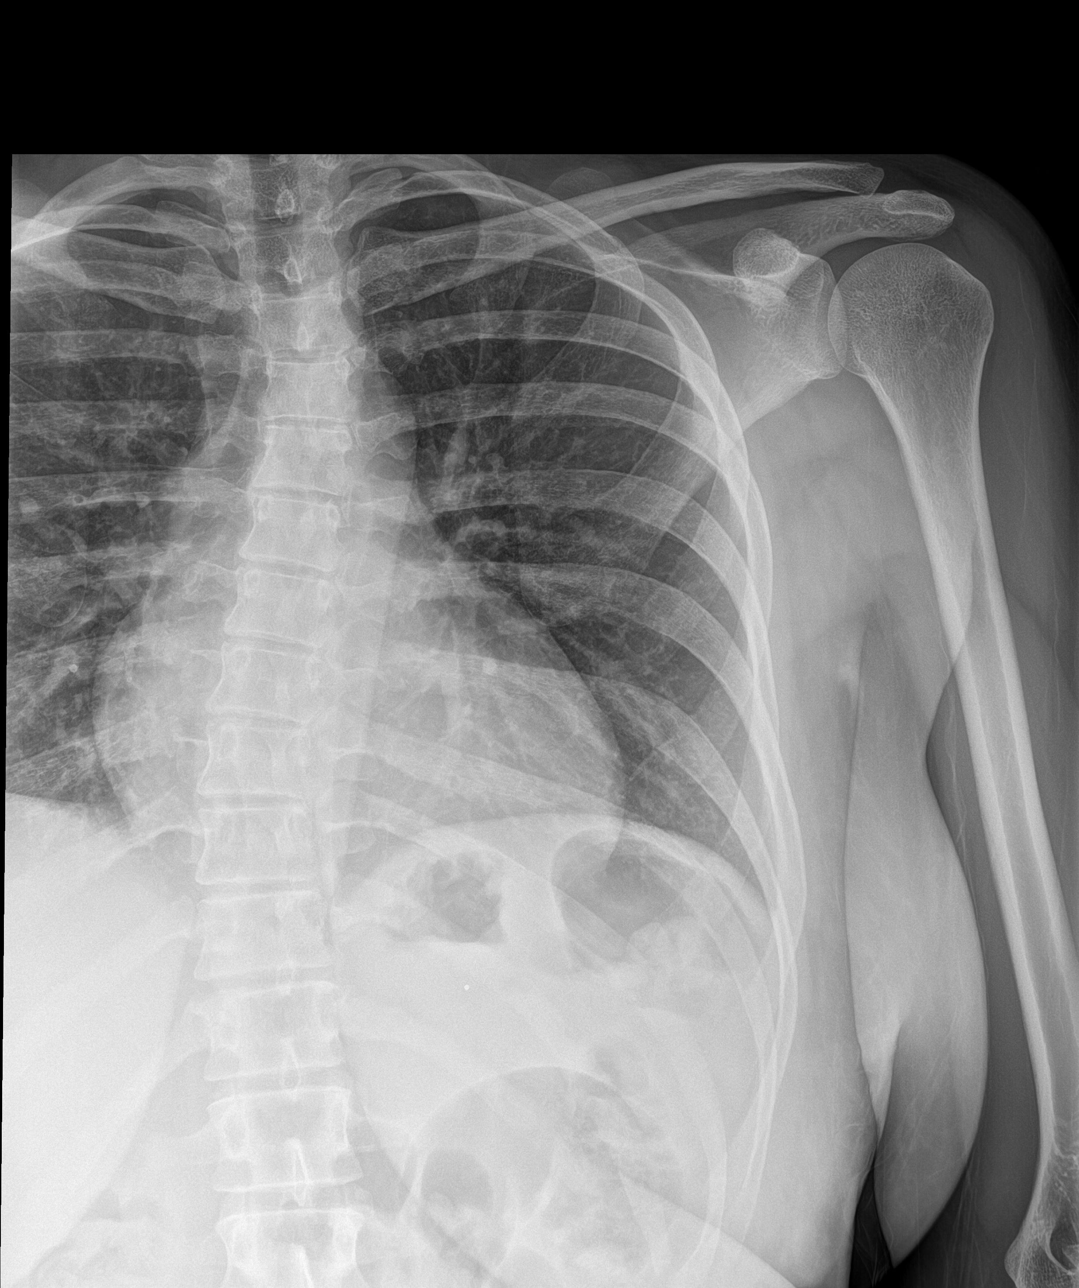

[rib obl]
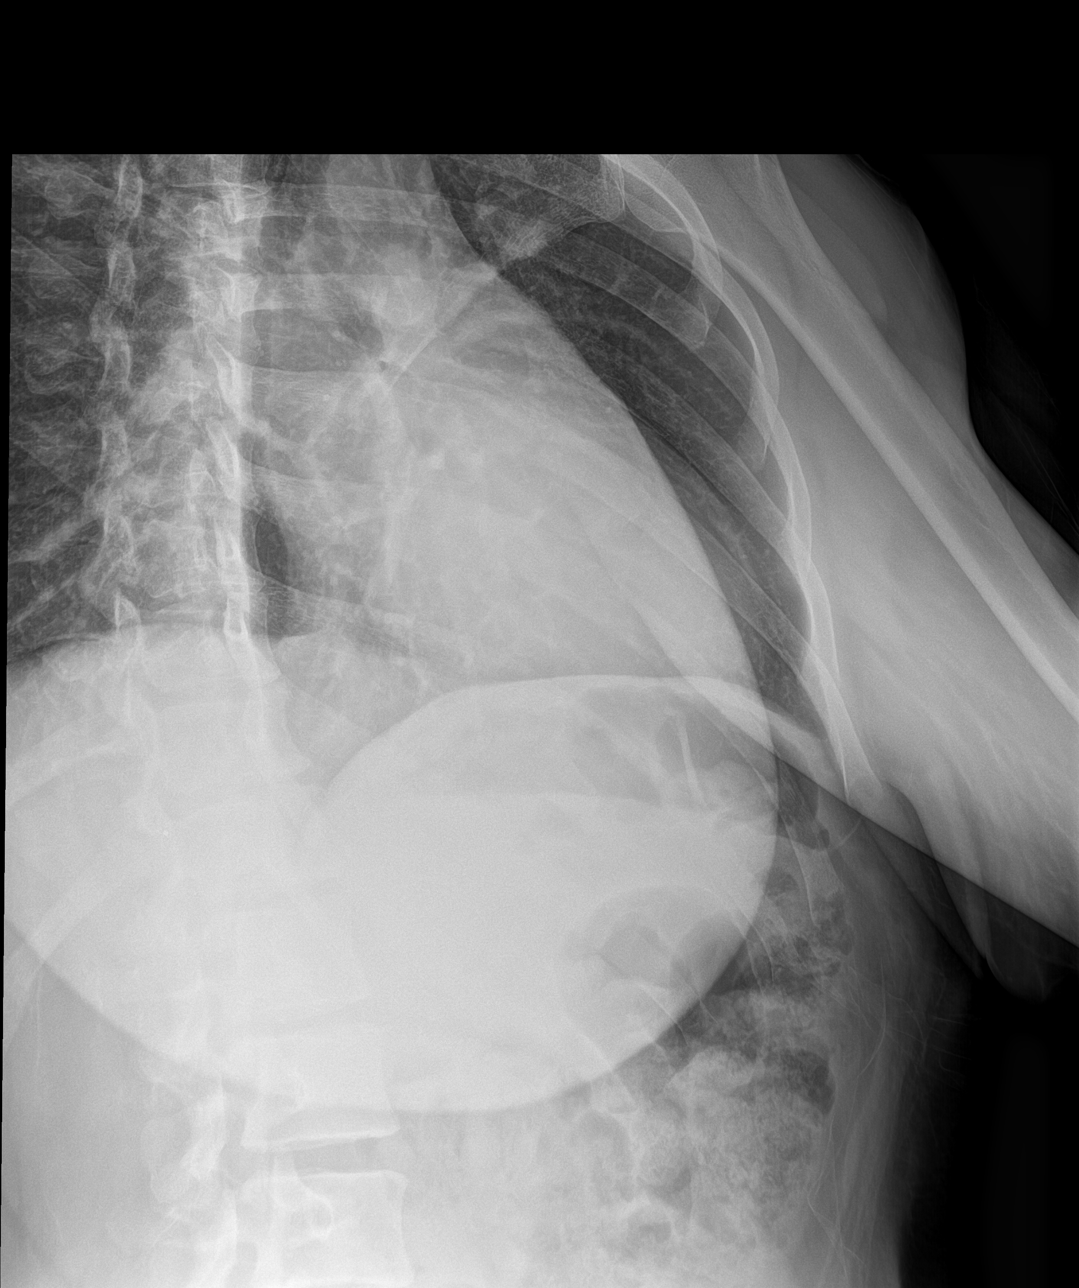

[rib ap (2 of 2)]
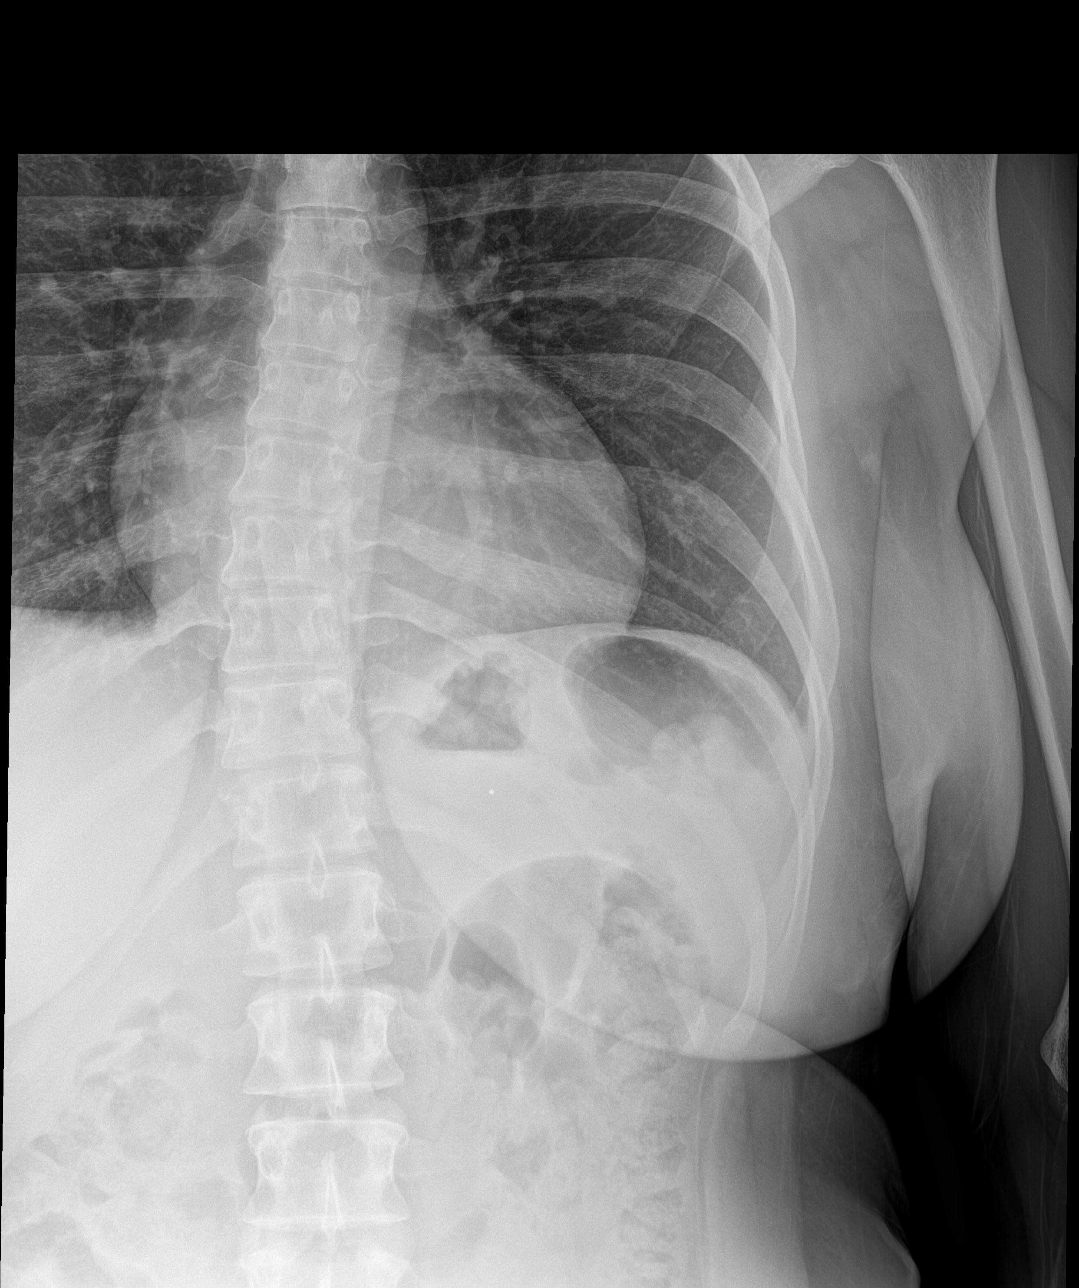

[4 of 4 positions shown; findings below may reference images not displayed]

FINDINGS: Lungs are adequately inflated without focal airspace consolidation,
effusion or pneumothorax. Cardiomediastinal silhouette is normal.
Minimal curvature of the thoracic spine convex right. No evidence of
rib fracture.
IMPRESSION: No acute findings.

## 2022-07-31 NOTE — L&D Delivery Note (Signed)
OB/GYN Faculty Practice Delivery Note  Becky Cochran is a 32 y.o. [id number] s/p VD at [redacted]w[redacted]d. 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   ROM: 1h 15m with clear fluid GBS Status:  Negative/-- (07/08 1506) Maximum Maternal Temperature: 98.0  Labor Progress: Initial SVE: 1.5/thick/-3. She then progressed to complete.   Delivery Date/Time: 02/24/2023 @0703  Delivery: Called to room and patient was complete and pushing. Head delivered OA. No nuchal cord present. Shoulder and body delivered in usual fashion. Infant with spontaneous cry, placed on mother's abdomen, dried and stimulated. Cord clamped x 2 after 1-minute delay, and cut by FOB. Cord blood drawn. Placenta delivered spontaneously with gentle cord traction. Fundus firm with massage and Pitocin. Labia, perineum, vagina, and cervix inspected. There were no lacerations.  Baby Weight: pending  Placenta: 3 vessel, intact. Sent to L&D Complications: None Lacerations: None EBL: 171 mL Analgesia: Maternally support   Infant:  APGAR (1 MIN): 9  APGAR (5 MINS): 9   Nea Gittens Autry-Lott, DO OB Fellow, [employer] [employer] 02/24/2023, 7:36 AM

## 2022-08-08 ENCOUNTER — Ambulatory Visit (INDEPENDENT_AMBULATORY_CARE_PROVIDER_SITE_OTHER): Payer: Medicaid Other

## 2022-08-08 VITALS — BP 127/84 | HR 69 | Ht 66.0 in | Wt 219.8 lb

## 2022-08-08 DIAGNOSIS — Z3201 Encounter for pregnancy test, result positive: Secondary | ICD-10-CM | POA: Diagnosis not present

## 2022-08-08 LAB — POCT URINE PREGNANCY: Preg Test, Ur: POSITIVE — AB

## 2022-08-08 NOTE — Progress Notes (Signed)
Ms. Doster presents today for UPT. She has no unusual complaints. LMP: 05/26/22    OBJECTIVE: Appears well, in no apparent distress.  OB History     Gravida  5   Para  3   Term  3   Preterm      AB  1   Living  3      SAB      IAB      Ectopic      Multiple  0   Live Births  3          Home UPT Result: Positive In-Office UPT result: Positive I have reviewed the patient's medical, obstetrical, social, and family histories, and medications.   ASSESSMENT: Positive pregnancy test  PLAN Prenatal care to be completed at: [employer] list given to patient.

## 2022-08-09 ENCOUNTER — Ambulatory Visit (INDEPENDENT_AMBULATORY_CARE_PROVIDER_SITE_OTHER): Payer: Medicaid Other

## 2022-08-09 ENCOUNTER — Ambulatory Visit: Payer: Medicaid Other | Admitting: *Deleted

## 2022-08-09 VITALS — BP 129/85 | Ht 66.0 in | Wt 222.1 lb

## 2022-08-09 DIAGNOSIS — Z3480 Encounter for supervision of other normal pregnancy, unspecified trimester: Secondary | ICD-10-CM | POA: Insufficient documentation

## 2022-08-09 DIAGNOSIS — Z3A11 11 weeks gestation of pregnancy: Secondary | ICD-10-CM | POA: Diagnosis not present

## 2022-08-09 DIAGNOSIS — O3680X1 Pregnancy with inconclusive fetal viability, fetus 1: Secondary | ICD-10-CM

## 2022-08-09 MED ORDER — BLOOD PRESSURE KIT DEVI: 1.0000 | 0 refills | Status: AC

## 2022-08-09 NOTE — Progress Notes (Signed)
New OB Intake  I connected withNAME@  on 08/09/22 at  1:25 PM EST by In Person Visit and verified that I am speaking with the correct person using two identifiers. Nurse is located at [facility] and pt is located at [place].  I discussed the limitations, risks, security and privacy concerns of performing an evaluation and management service by telephone and the availability of in person appointments. I also discussed with the patient that there may be a patient responsible charge related to this service. The patient expressed understanding and agreed to proceed.  I explained I am completing New OB Intake today. We discussed EDD of 03/02/23 that is based on LMP of 05/26/22. Pt is G5/P3. I reviewed her allergies, medications, Medical/Surgical/OB history, and appropriate screenings. I informed her of [facility] services. [facility] information placed in AVS. Based on history, this is a low risk pregnancy.  Patient Active Problem List   Diagnosis Date Noted   Non-reactive NST (non-stress test) 01/30/2019   Post-dates pregnancy 01/29/2019   Anemia affecting pregnancy in third trimester 11/13/2018   Supervision of other normal pregnancy, antepartum 07/08/2018    Concerns addressed today  Delivery Plans Plans to deliver at [facility]. Patient given information for [facility] Healthy Baby website for more information about Women's and Alicia. Patient is interested in water birth. Offered upcoming OB visit with CNM to discuss further.  MyChart/Babyscripts MyChart access verified. I explained pt will have some visits in office and some virtually. Babyscripts instructions given and order placed. Patient verifies receipt of registration text/e-mail. Account successfully created and app downloaded.  Blood Pressure Cuff/Weight Scale Blood pressure cuff ordered for patient to pick-up from [employer]. Explained after first prenatal appt pt will check weekly and document in 26. Patient does not have weight  scale; patient may purchase if they desire to track weight weekly in Babyscripts.  Anatomy US Explained first scheduled [place] will be around 19 weeks. Anatomy US scheduled for 19 wks at MFM. Pt notified to arrive at TBD.  Labs Discussed Johnsie Cancel genetic screening with patient. Would like both Panorama and Horizon drawn at new OB visit. Routine prenatal labs needed.  COVID Vaccine Patient has had COVID vaccine.   Social Determinants of Health Food Insecurity: Patient denies food insecurity. WIC Referral: Patient is interested in referral to [facility].  Transportation: Patient denies transportation needs. Childcare: Discussed no children allowed at ultrasound appointments. Offered childcare services; patient declines childcare services at this time.  First visit review I reviewed new OB appt with patient. I explained they will have a provider visit that includes Pap, GC/CC, OB urine. Explained pt will be seen by Fatima Blank at first visit; encounter routed to appropriate provider. Explained that patient will be seen by pregnancy navigator following visit with provider.   Penny Pia, RN 08/09/2022  1:13 PM

## 2022-08-16 ENCOUNTER — Encounter: Payer: Self-pay | Admitting: Obstetrics and Gynecology

## 2022-08-25 ENCOUNTER — Other Ambulatory Visit: Payer: Medicaid Other

## 2022-08-25 DIAGNOSIS — Z3480 Encounter for supervision of other normal pregnancy, unspecified trimester: Secondary | ICD-10-CM

## 2022-08-26 LAB — COMPREHENSIVE METABOLIC PANEL
ALT: 6 IU/L (ref 0–32)
AST: 9 IU/L (ref 0–40)
Albumin/Globulin Ratio: 1.5 (ref 1.2–2.2)
Albumin: 3.9 g/dL — ABNORMAL LOW (ref 4.0–5.0)
Alkaline Phosphatase: 46 IU/L (ref 44–121)
BUN/Creatinine Ratio: 8 — ABNORMAL LOW (ref 9–23)
BUN: 6 mg/dL (ref 6–20)
Bilirubin Total: 0.5 mg/dL (ref 0.0–1.2)
CO2: 21 mmol/L (ref 20–29)
Calcium: 9.3 mg/dL (ref 8.7–10.2)
Chloride: 102 mmol/L (ref 96–106)
Creatinine, Ser: 0.71 mg/dL (ref 0.57–1.00)
Globulin, Total: 2.6 g/dL (ref 1.5–4.5)
Glucose: 89 mg/dL (ref 70–99)
Potassium: 4 mmol/L (ref 3.5–5.2)
Sodium: 137 mmol/L (ref 134–144)
Total Protein: 6.5 g/dL (ref 6.0–8.5)
eGFR: 118 mL/min/{1.73_m2} (ref >59)

## 2022-08-26 LAB — CBC/D/PLT+RPR+RH+ABO+RUBIGG...
Antibody Screen: NEGATIVE
Basophils Absolute: 0 10*3/uL (ref 0.0–0.2)
Basos: 0 %
EOS (ABSOLUTE): 0.1 10*3/uL (ref 0.0–0.4)
Eos: 2 %
HCV Ab: NONREACTIVE
HIV Screen 4th Generation wRfx: NONREACTIVE
Hematocrit: 34.2 % (ref 34.0–46.6)
Hemoglobin: 11.2 g/dL (ref 11.1–15.9)
Hepatitis B Surface Ag: NEGATIVE
Immature Grans (Abs): 0 10*3/uL (ref 0.0–0.1)
Immature Granulocytes: 0 %
Lymphocytes Absolute: 1.5 10*3/uL (ref 0.7–3.1)
Lymphs: 19 %
MCH: 28 pg (ref 26.6–33.0)
MCHC: 32.7 g/dL (ref 31.5–35.7)
MCV: 86 fL (ref 79–97)
Monocytes Absolute: 0.5 10*3/uL (ref 0.1–0.9)
Monocytes: 6 %
Neutrophils Absolute: 5.7 10*3/uL (ref 1.4–7.0)
Neutrophils: 73 %
Platelets: 269 10*3/uL (ref 150–450)
RBC: 4 x10E6/uL (ref 3.77–5.28)
RDW: 13.1 % (ref 11.7–15.4)
RPR Ser Ql: NONREACTIVE
Rh Factor: POSITIVE
Rubella Antibodies, IGG: 5.8 index (ref >0.99)
WBC: 7.8 10*3/uL (ref 3.4–10.8)

## 2022-08-26 LAB — HEMOGLOBIN A1C
Est. average glucose Bld gHb Est-mCnc: 117 mg/dL
Hgb A1c MFr Bld: 5.7 % — ABNORMAL HIGH (ref 4.8–5.6)

## 2022-08-26 LAB — HCV INTERPRETATION

## 2022-09-02 LAB — PANORAMA PRENATAL TEST FULL PANEL:PANORAMA TEST PLUS 5 ADDITIONAL MICRODELETIONS: FETAL FRACTION: 5.4

## 2022-09-04 ENCOUNTER — Ambulatory Visit (INDEPENDENT_AMBULATORY_CARE_PROVIDER_SITE_OTHER): Payer: Medicaid Other | Admitting: Obstetrics and Gynecology

## 2022-09-04 ENCOUNTER — Encounter: Payer: Self-pay | Admitting: Obstetrics and Gynecology

## 2022-09-04 ENCOUNTER — Other Ambulatory Visit (HOSPITAL_COMMUNITY)
Admission: RE | Admit: 2022-09-04 | Discharge: 2022-09-04 | Disposition: A | Discharge status: Home / Self Care | Payer: Medicaid Other | Source: Ambulatory Visit | Attending: Advanced Practice Midwife | Admitting: Advanced Practice Midwife

## 2022-09-04 VITALS — BP 131/79 | HR 66 | Wt 220.7 lb

## 2022-09-04 DIAGNOSIS — Z3480 Encounter for supervision of other normal pregnancy, unspecified trimester: Secondary | ICD-10-CM | POA: Diagnosis not present

## 2022-09-04 DIAGNOSIS — Z3A14 14 weeks gestation of pregnancy: Secondary | ICD-10-CM | POA: Diagnosis not present

## 2022-09-04 DIAGNOSIS — O24419 Gestational diabetes mellitus in pregnancy, unspecified control: Secondary | ICD-10-CM

## 2022-09-04 DIAGNOSIS — O99210 Obesity complicating pregnancy, unspecified trimester: Secondary | ICD-10-CM | POA: Insufficient documentation

## 2022-09-04 DIAGNOSIS — Z3482 Encounter for supervision of other normal pregnancy, second trimester: Secondary | ICD-10-CM

## 2022-09-04 NOTE — Progress Notes (Signed)
Pt presents for NOB visit. Last PAP 2019.

## 2022-09-04 NOTE — Progress Notes (Signed)
  Subjective:    Becky Cochran is a [id number] [redacted]w[redacted]d being seen today for her first obstetrical visit.  Her obstetrical history is significant for history of GDM with second pregnancy and maternal obesity. Patient does not intend to breast feed. Pregnancy history fully reviewed.  Patient reports no complaints.  Vitals:   09/04/22 1446  BP: 131/79  Pulse: 66  Weight: 220 lb 11.2 oz (100.1 kg)    HISTORY: OB History  Gravida Para Term Preterm AB Living  5 3 3   1 3   SAB IAB Ectopic Multiple Live Births        0 3    # Outcome Date GA Lbr Len/2nd Weight Sex Delivery Anes PTL Lv  5 Current           4 Term 01/30/19 [redacted]w[redacted]d 01:09 / 00:01 6 lb 10.5 oz (3.019 kg) M Vag-Spont None  LIV  3 AB 10/2016          2 Term 12/14/13 [redacted]w[redacted]d  8 lb 1 oz (3.657 kg) M Vag-Spont EPI N LIV  1 Term 06/25/12 [redacted]w[redacted]d  7 lb 8 oz (3.402 kg) F Vag-Spont EPI N LIV   Past Medical History:  Diagnosis Date   Medical history non-contributory    Past Surgical History:  Procedure Laterality Date   NO PAST SURGERIES     Family History  Problem Relation Age of Onset   Diabetes Mother    Heart disease Mother    Kidney disease Mother    Hypertension Mother    Cancer Maternal Grandmother    Cancer Paternal Grandmother      Exam    Uterus:   14-weeks  Pelvic Exam:    Perineum: No Hemorrhoids, Hemorrhoids   Vulva: normal   Vagina:  normal mucosa, normal discharge   pH:    Cervix: multiparous appearance and cervix is closed and long   Adnexa: normal adnexa   Bony Pelvis: gynecoid  System: Breast:  normal appearance, no masses or tenderness   Skin: normal coloration and turgor, no rashes    Neurologic: oriented, no focal deficits   Extremities: normal strength, tone, and muscle mass   HEENT extra ocular movement intact   Mouth/Teeth mucous membranes moist, pharynx normal without lesions and dental hygiene good   Neck supple and no masses   Cardiovascular: regular rate and rhythm   Respiratory:  appears  well, vitals normal, no respiratory distress, acyanotic, normal RR, chest clear, no wheezing, crepitations, rhonchi, normal symmetric air entry   Abdomen: soft, non-tender; bowel sounds normal; no masses,  no organomegaly   Urinary:       Assessment:    Pregnancy: [id number] Patient Active Problem List   Diagnosis Date Noted   Supervision of other normal pregnancy, antepartum 08/09/2022   Non-reactive NST (non-stress test) 01/30/2019   Post-dates pregnancy 01/29/2019   Anemia affecting pregnancy in third trimester 11/13/2018        Plan:     Initial labs drawn. Prenatal vitamins. Problem list reviewed and updated. Genetic Screening discussed : results reviewed.  Ultrasound discussed; fetal survey: ordered. Patient to return fasting for early glucola  Follow up in 4 weeks. 50% of 30 min visit spent on counseling and coordination of care.     Tyjai Charbonnet 09/04/2022

## 2022-09-04 NOTE — Patient Instructions (Addendum)
Considering Waterbirth? Guide for patients at [facility]) Why consider waterbirth? Gentle birth for babies  Less pain medicine used in labor  May allow for passive descent/less pushing  May reduce perineal tears  More mobility and instinctive maternal position changes  Increased maternal relaxation   Is waterbirth safe? What are the risks of infection, drowning or other complications? Infection:  Very low risk (3.7 % for tub vs 4.8% for bed)  7 in 8000 waterbirths with documented infection  Poorly cleaned equipment most common cause  Slightly lower group B strep transmission rate  Drowning  Maternal:  Very low risk  Related to seizures or fainting  Newborn:  Very low risk. No evidence of increased risk of respiratory problems in multiple large studies  Physiological protection from breathing under water  Avoid underwater birth if there are any fetal complications  Once baby's head is out of the water, keep it out.  Birth complication  Some reports of cord trauma, but risk decreased by bringing baby to surface gradually  No evidence of increased risk of shoulder dystocia. Mothers can usually change positions faster in water than in a bed, possibly aiding the maneuvers to free the shoulder.   There are 2 things you MUST do to have a waterbirth with [facility]: Attend a waterbirth class at [place] at [facility]   3rd Wednesday of every month from 7-9 pm (virtual during [place]) [employer] at [email or web address] or [email or web address] or by calling [phone] Bring [place] the certificate from the class to your prenatal appointment or send via [place] with a midwife at 36 weeks* to see if you can still plan a waterbirth and to sign the consent.   *We also recommend that you schedule as many of your prenatal visits with a midwife as possible.    Helpful information: You may want to bring a bathing suit top to the hospital  to wear during labor but this is optional.  All other supplies are provided by the hospital. Please arrive at the hospital with signs of active labor, and do not wait at home until late in labor. It takes 45 min- 1 hour for fetal monitoring, and check in to your room to take place, plus transport and filling of the waterbirth tub.    Things that would prevent you from having a waterbirth: Premature, <37wks  Previous cesarean birth  Presence of thick meconium-stained fluid  Multiple gestation (Twins, triplets, etc.)  Uncontrolled diabetes or gestational diabetes requiring medication  Hypertension diagnosed in pregnancy or preexisting hypertension (gestational hypertension, preeclampsia, or chronic hypertension) Fetal growth restriction (your baby measures less than 10th percentile on ultrasound) Heavy vaginal bleeding  Non-reassuring fetal heart rate  Active infection (MRSA, etc.). Group B Strep is NOT a contraindication for waterbirth.  If your labor has to be induced and induction method requires continuous monitoring of the baby's heart rate  Other risks/issues identified by your obstetrical provider   Please remember that birth is unpredictable. Under certain unforeseeable circumstances your provider may advise against giving birth in the tub. These decisions will be made on a case-by-case basis and with the safety of you and your baby as our highest priority.    Updated 11/02/21   Second Trimester of Pregnancy  The second trimester of pregnancy is from week 13 through week 67. This is months 4 through 6 of pregnancy. The second trimester is often a time when you feel your best. Your body has adjusted  to being pregnant, and you begin to feel better physically. During the second trimester: Morning sickness has lessened or stopped completely. You may have more energy. You may have an increase in appetite. The second trimester is also a time when the unborn baby (fetus) is growing  rapidly. At the end of the sixth month, the fetus may be up to 12 inches long and weigh about 1 pounds. You will likely begin to feel the baby move (quickening) between 16 and 20 weeks of pregnancy. Body changes during your second trimester Your body continues to go through many changes during your second trimester. The changes vary and generally return to normal after the baby is born. Physical changes Your weight will continue to increase. You will notice your lower abdomen bulging out. You may begin to get stretch marks on your hips, abdomen, and breasts. Your breasts will continue to grow and to become tender. Dark spots or blotches (chloasma or mask of pregnancy) may develop on your face. A dark line from your belly button to the pubic area (linea nigra) may appear. You may have changes in your hair. These can include thickening of your hair, rapid growth, and changes in texture. Some people also have hair loss during or after pregnancy, or hair that feels dry or thin. Health changes You may develop headaches. You may have heartburn. You may develop constipation. You may develop hemorrhoids or swollen, bulging veins (varicose veins). Your gums may bleed and may be sensitive to brushing and flossing. You may urinate more often because the fetus is pressing on your bladder. You may have back pain. This is caused by: Weight gain. Pregnancy hormones that are relaxing the joints in your pelvis. A shift in weight and the muscles that support your balance. Follow these instructions at home: Medicines Follow your health care provider's instructions regarding medicine use. Specific medicines may be either safe or unsafe to take during pregnancy. Do not take any medicines unless approved by your health care provider. Take a prenatal vitamin that contains at least 600 micrograms (mcg) of folic acid. Eating and drinking Eat a healthy diet that includes fresh fruits and vegetables, whole grains,  good sources of protein such as meat, eggs, or tofu, and low-fat dairy products. Avoid raw meat and unpasteurized juice, milk, and cheese. These carry germs that can harm you and your baby. You may need to take these actions to prevent or treat constipation: Drink enough fluid to keep your urine pale yellow. Eat foods that are high in fiber, such as beans, whole grains, and fresh fruits and vegetables. Limit foods that are high in fat and processed sugars, such as fried or sweet foods. Activity Exercise only as directed by your health care provider. Most people can continue their usual exercise routine during pregnancy. Try to exercise for 30 minutes at least 5 days a week. Stop exercising if you develop contractions in your uterus. Stop exercising if you develop pain or cramping in the lower abdomen or lower back. Avoid exercising if it is very hot or humid or if you are at a high altitude. Avoid heavy lifting. If you choose to, you may have sex unless your health care provider tells you not to. Relieving pain and discomfort Wear a supportive bra to prevent discomfort from breast tenderness. Take warm sitz baths to soothe any pain or discomfort caused by hemorrhoids. Use hemorrhoid cream if your health care provider approves. Rest with your legs raised (elevated) if you have leg cramps  or low back pain. If you develop varicose veins: Wear support hose as told by your health care provider. Elevate your feet for 15 minutes, 3-4 times a day. Limit salt in your diet. Safety Wear your seat belt at all times when driving or riding in a car. Talk with your health care provider if someone is verbally or physically abusive to you. Lifestyle Do not use hot tubs, steam rooms, or saunas. Do not douche. Do not use tampons or scented sanitary pads. Avoid cat litter boxes and soil used by cats. These carry germs that can cause birth defects in the baby and possibly loss of the fetus by miscarriage or  stillbirth. Do not use herbal remedies, alcohol, illegal drugs, or medicines that are not approved by your health care provider. Chemicals in these products can harm your baby. Do not use any products that contain nicotine or tobacco, such as cigarettes, e-cigarettes, and chewing tobacco. If you need help quitting, ask your health care provider. General instructions During a routine prenatal visit, your health care provider will do a physical exam and other tests. He or she will also discuss your overall health. Keep all follow-up visits. This is important. Ask your health care provider for a referral to a local prenatal education class. Ask for help if you have counseling or nutritional needs during pregnancy. Your health care provider can offer advice or refer you to specialists for help with various needs. Where to find more information American Pregnancy Association: americanpregnancy.[place] and Gynecologists: PoolDevices.com.pt Office on [employer] Health: KeywordPortfolios.com.br Contact a health care provider if you have: A headache that does not go away when you take medicine. Vision changes or you see spots in front of your eyes. Mild pelvic cramps, pelvic pressure, or nagging pain in the abdominal area. Persistent nausea, vomiting, or diarrhea. A bad-smelling vaginal discharge or foul-smelling urine. Pain when you urinate. Sudden or extreme swelling of your face, hands, ankles, feet, or legs. A fever. Get help right away if you: Have fluid leaking from your vagina. Have spotting or bleeding from your vagina. Have severe abdominal cramping or pain. Have difficulty breathing. Have chest pain. Have fainting spells. Have not felt your baby move for the time period told by your health care provider. Have new or increased pain, swelling, or redness in an arm or leg. Summary The second trimester of pregnancy is from week 13 through  week 27 (months 4 through 6). Do not use herbal remedies, alcohol, illegal drugs, or medicines that are not approved by your health care provider. Chemicals in these products can harm your baby. Exercise only as directed by your health care provider. Most people can continue their usual exercise routine during pregnancy. Keep all follow-up visits. This is important. This information is not intended to replace advice given to you by your health care provider. Make sure you discuss any questions you have with your health care provider. Document Revised: 12/24/2019 Document Reviewed: 10/30/2019 Elsevier Patient Education  [place].

## 2022-09-05 LAB — HORIZON CUSTOM: REPORT SUMMARY: POSITIVE — AB

## 2022-09-05 LAB — CERVICOVAGINAL ANCILLARY ONLY
Chlamydia: NEGATIVE
Comment: NEGATIVE
Comment: NORMAL
Neisseria Gonorrhea: NEGATIVE

## 2022-09-07 LAB — CYTOLOGY - PAP: Diagnosis: NEGATIVE

## 2022-09-19 ENCOUNTER — Other Ambulatory Visit: Payer: Medicaid Other

## 2022-09-19 ENCOUNTER — Other Ambulatory Visit: Payer: Self-pay

## 2022-09-19 DIAGNOSIS — Z3480 Encounter for supervision of other normal pregnancy, unspecified trimester: Secondary | ICD-10-CM

## 2022-09-19 MED ORDER — PRENATAL 27-0.8 MG PO TABS: 1.0000 | ORAL_TABLET | Freq: Every day | ORAL | 12 refills | Status: AC

## 2022-09-19 NOTE — Progress Notes (Signed)
Pt requests PNV rx. She does not like the gummies.

## 2022-09-19 NOTE — Progress Notes (Unsigned)
gt

## 2022-09-20 LAB — GLUCOSE TOLERANCE, 2 HOURS W/ 1HR
Glucose, 1 hour: 201 mg/dL — ABNORMAL HIGH (ref 70–179)
Glucose, 2 hour: 109 mg/dL (ref 70–152)
Glucose, Fasting: 91 mg/dL (ref 70–91)

## 2022-09-22 ENCOUNTER — Other Ambulatory Visit: Payer: Self-pay

## 2022-09-22 ENCOUNTER — Emergency Department (HOSPITAL_BASED_OUTPATIENT_CLINIC_OR_DEPARTMENT_OTHER)
Admission: EM | Admit: 2022-09-22 | Discharge: 2022-09-22 | Disposition: A | Discharge status: Home / Self Care | Payer: Medicaid Other | Attending: Emergency Medicine | Admitting: Emergency Medicine

## 2022-09-22 ENCOUNTER — Other Ambulatory Visit (HOSPITAL_BASED_OUTPATIENT_CLINIC_OR_DEPARTMENT_OTHER): Payer: Self-pay

## 2022-09-22 ENCOUNTER — Encounter (HOSPITAL_BASED_OUTPATIENT_CLINIC_OR_DEPARTMENT_OTHER): Payer: Self-pay | Admitting: Emergency Medicine

## 2022-09-22 DIAGNOSIS — U071 COVID-19: Secondary | ICD-10-CM | POA: Diagnosis not present

## 2022-09-22 DIAGNOSIS — O98512 Other viral diseases complicating pregnancy, second trimester: Secondary | ICD-10-CM | POA: Insufficient documentation

## 2022-09-22 DIAGNOSIS — Z3A18 18 weeks gestation of pregnancy: Secondary | ICD-10-CM | POA: Diagnosis not present

## 2022-09-22 DIAGNOSIS — O209 Hemorrhage in early pregnancy, unspecified: Secondary | ICD-10-CM | POA: Insufficient documentation

## 2022-09-22 DIAGNOSIS — O4692 Antepartum hemorrhage, unspecified, second trimester: Secondary | ICD-10-CM

## 2022-09-22 LAB — CBC WITH DIFFERENTIAL/PLATELET
Abs Immature Granulocytes: 0.04 10*3/uL (ref 0.00–0.07)
Basophils Absolute: 0 10*3/uL (ref 0.0–0.1)
Basophils Relative: 0 %
Eosinophils Absolute: 0.1 10*3/uL (ref 0.0–0.5)
Eosinophils Relative: 1 %
HCT: 29.5 % — ABNORMAL LOW (ref 36.0–46.0)
Hemoglobin: 10.1 g/dL — ABNORMAL LOW (ref 12.0–15.0)
Immature Granulocytes: 1 %
Lymphocytes Relative: 3 %
Lymphs Abs: 0.3 10*3/uL — ABNORMAL LOW (ref 0.7–4.0)
MCH: 28.4 pg (ref 26.0–34.0)
MCHC: 34.2 g/dL (ref 30.0–36.0)
MCV: 82.9 fL (ref 80.0–100.0)
Monocytes Absolute: 0.4 10*3/uL (ref 0.1–1.0)
Monocytes Relative: 5 %
Neutro Abs: 7.6 10*3/uL (ref 1.7–7.7)
Neutrophils Relative %: 90 %
Platelets: 210 10*3/uL (ref 150–400)
RBC: 3.56 MIL/uL — ABNORMAL LOW (ref 3.87–5.11)
RDW: 12.9 % (ref 11.5–15.5)
WBC: 8.4 10*3/uL (ref 4.0–10.5)
nRBC: 0 % (ref 0.0–0.2)

## 2022-09-22 LAB — COMPREHENSIVE METABOLIC PANEL
ALT: 10 U/L (ref 0–44)
AST: 19 U/L (ref 15–41)
Albumin: 3.7 g/dL (ref 3.5–5.0)
Alkaline Phosphatase: 34 U/L — ABNORMAL LOW (ref 38–126)
Anion gap: 11 (ref 5–15)
BUN: 6 mg/dL (ref 6–20)
CO2: 22 mmol/L (ref 22–32)
Calcium: 9 mg/dL (ref 8.9–10.3)
Chloride: 103 mmol/L (ref 98–111)
Creatinine, Ser: 0.56 mg/dL (ref 0.44–1.00)
GFR, Estimated: >60 mL/min (ref >60)
Glucose, Bld: 108 mg/dL — ABNORMAL HIGH (ref 70–99)
Potassium: 3.7 mmol/L (ref 3.5–5.1)
Sodium: 136 mmol/L (ref 135–145)
Total Bilirubin: 0.5 mg/dL (ref 0.3–1.2)
Total Protein: 6.7 g/dL (ref 6.5–8.1)

## 2022-09-22 LAB — ABO/RH: ABO/RH(D): A POS

## 2022-09-22 LAB — RESP PANEL BY RT-PCR (RSV, FLU A&B, COVID)  RVPGX2
Influenza A by PCR: NEGATIVE
Influenza B by PCR: NEGATIVE
Resp Syncytial Virus by PCR: NEGATIVE
SARS Coronavirus 2 by RT PCR: POSITIVE — AB

## 2022-09-22 LAB — HCG, QUANTITATIVE, PREGNANCY: hCG, Beta Chain, Quant, S: 17812 m[IU]/mL — ABNORMAL HIGH (ref <5)

## 2022-09-22 MED ORDER — METOCLOPRAMIDE HCL 5 MG/ML IJ SOLN
10.0000 mg | Freq: Once | INTRAMUSCULAR | Status: AC
  Administered 2022-09-22: 10 mg via INTRAVENOUS
  Filled 2022-09-22: qty 2

## 2022-09-22 NOTE — ED Notes (Signed)
Fetal heart rate doppler read at 160.

## 2022-09-22 NOTE — ED Provider Notes (Signed)
[facility] Provider Note   CSN: [id number] Arrival date & time: 09/22/22  [id number]     History  Chief Complaint  Patient presents with   Vaginal Bleeding    Becky Cochran is a 32 y.o. female.   Vaginal Bleeding    Patient is a 32 year old female presenting to the emergency department due to vaginal bleeding.  Started this morning, started off as small streaks of pink blood in her vaginal canal but she has been having increased bleeding throughout the day.  It is mostly painless although intermittently has some cramping pain to the lower abdomen.  She states this morning she also woke up with fever, generalized bodyaches and a headache, took Tylenol at 6:30 AM.  She has confirmed IUP, she is roughly [redacted] weeks gestational age, this is her fifth pregnancy and previous forward vaginal deliveries without complication.  She is currently being treated for yeast infection but denies any new vaginal discharge.  Patient states she has been having headaches this pregnancy, over the last week she has been having more frequent headaches.  They are circumferential to the posterior scalp, not unilateral.  They come and go, they improved with Tylenol and throughout the day.  Is not worse first thing in the morning, denies thunderclap onset.  No photophobia, nausea, vomiting, diplopia, lateralized weakness.  Home Medications Prior to Admission medications   Medication Sig Start Date End Date Taking? Authorizing Provider  benzonatate (TESSALON) 100 MG capsule Take 1 capsule (100 mg total) by mouth every 8 (eight) hours. Patient not taking: Reported on 09/04/2022 07/03/20   Emerson Monte, FNP  Blood Pressure Monitoring (BLOOD PRESSURE KIT) DEVI 1 Device by Does not apply route once a week. Patient not taking: Reported on 09/04/2022 08/09/22   Griffin Basil, MD  cetirizine (ZYRTEC ALLERGY) 10 MG tablet Take 1 tablet (10 mg total) by mouth daily. Patient not  taking: Reported on 09/04/2022 07/03/20   Emerson Monte, FNP  fluconazole (DIFLUCAN) 150 MG tablet Take one 150 mg tablet now and repeat in 3 days/. Patient not taking: Reported on 09/04/2022 12/06/21   Perlie Mayo, NP  ibuprofen (ADVIL) 800 MG tablet Take 1 tablet (800 mg total) by mouth 3 (three) times daily. Patient not taking: Reported on 09/04/2022 01/31/19   Julianne Handler, CNM  metroNIDAZOLE (FLAGYL) 500 MG tablet Take 1 tablet (500 mg total) by mouth 2 (two) times daily. Patient not taking: Reported on 09/04/2022 06/22/20   Chase Picket, MD  NORLYDA 0.35 MG tablet TAKE 1 TABLET(0.35 MG) BY MOUTH DAILY Patient not taking: Reported on 09/04/2022 01/12/20   Laury Deep, CNM  Prenatal Vit-Fe Fumarate-FA (MULTIVITAMIN-PRENATAL) 27-0.8 MG TABS tablet Take 1 tablet by mouth daily. 09/19/22   Inez Catalina, MD  Prenatal Vit-Fe Phos-FA-Omega (VITAFOL GUMMIES) 3.33-0.333-34.8 MG CHEW Chew 3 each by mouth daily. 07/08/18   Laury Deep, CNM      Allergies    Patient has no known allergies.    Review of Systems   Review of Systems  Genitourinary:  Positive for vaginal bleeding.    Physical Exam Updated Vital Signs BP 138/86 (BP Location: Right Arm)   Pulse (!) 107   Temp 98.3 F (36.8 C) (Oral)   Resp 20   Ht '5\' 6"'$  (1.676 m)   Wt 99.8 kg   LMP 05/26/2022 (Exact Date)   SpO2 100%   BMI 35.51 kg/m  Physical Exam Vitals and nursing note reviewed. Exam conducted  with a chaperone present.  Constitutional:      Appearance: Normal appearance.  HENT:     Head: Normocephalic and atraumatic.  Eyes:     General: No scleral icterus.       Right eye: No discharge.        Left eye: No discharge.     Extraocular Movements: Extraocular movements intact.     Pupils: Pupils are equal, round, and reactive to light.  Cardiovascular:     Rate and Rhythm: Normal rate and regular rhythm.     Pulses: Normal pulses.     Heart sounds: Normal heart sounds.     No friction rub. No  gallop.  Pulmonary:     Effort: Pulmonary effort is normal. No respiratory distress.     Breath sounds: Normal breath sounds.  Abdominal:     General: Abdomen is flat. Bowel sounds are normal. There is no distension.     Palpations: Abdomen is soft.     Tenderness: There is no abdominal tenderness.  Genitourinary:    Comments: Pelvic exam performed with PA student [place] as chaperone in room.  Cervical os is closed, trace pooling in the vaginal vault less than one third, no friability Skin:    General: Skin is warm and dry.     Coloration: Skin is not jaundiced.  Neurological:     Mental Status: She is alert. Mental status is at baseline.     Coordination: Coordination normal.     Comments: Cranial nerves II through XII are grossly intact.  Upper and lower extremity strength is symmetric bilaterally.  Normal finger-to-nose.     ED Results / Procedures / Treatments   Labs (all labs ordered are listed, but only abnormal results are displayed) Labs Reviewed  RESP PANEL BY RT-PCR (RSV, FLU A&B, COVID)  [id number]  COMPREHENSIVE METABOLIC PANEL  CBC WITH DIFFERENTIAL/PLATELET  HCG, QUANTITATIVE, PREGNANCY  ABO/RH    EKG None  Radiology No results found.  Procedures Procedures    Medications Ordered in ED Medications - No data to display  ED Course/ Medical Decision Making/ A&P                             Medical Decision Making Amount and/or Complexity of Data Reviewed Labs: ordered.  Risk Prescription drug management.   Patient presents to the emergency department due to vaginal bleeding, headache, viral symptoms.  Differential includes viral URI, threatened abortion, symptomatic anemia, hemorrhagic shock.  Patient is too early in her pregnancy for preeclampsia, additionally she is not hypertensive and not having any systemic symptoms.  She had a normal neuroexam, this headache is been going on during the pregnancy so although I considered meningitis, cerebral venous  thrombosis, ICH I think conditions are unlikely and that her headache is most likely secondary to a viral etiology.  Will treat with Reglan and then reevaluate.  I also ordered laboratory workup which I interpreted, no leukocytosis, mildly anemic but roughly baseline per chart review.  Patient is positive for COVID.  Patient meets no SIRS criteria, not septic and not a septic abortion.  I reevaluated the patient who is resting comfortably, headache is improved.  We discussed the workup, expected management and plan to have her follow-up closely with her OB/GYN oncologist.  hCG quant was obtained so we can trend values.        Final Clinical Impression(s) / ED Diagnoses Final diagnoses:  None  Rx / DC Orders ED Discharge Orders     None         Sherrill Raring, Hershal Coria 09/22/22 [place], [place], DO 09/22/22 1511

## 2022-09-22 NOTE — ED Triage Notes (Signed)
Pt caox4, ambulatory, NAD. Pt reports at approx 0200 she woke up with bodyaches, temp 99.6, headache, went to the bathroom 0300 had spotting light pink, then around 0600 she noticed slightly more blood in the toilet. Pt denies abdominal cramping but states she has a dull pain in the lower abd.   18 weeks, 2/4 last OB/GYN appt, [id number], due date 7/31  Tylenol '500mg'$  '@0630'$ 

## 2022-09-22 NOTE — Discharge Instructions (Signed)
Take Tylenol as needed for headache.  You have COVID-19, this is a virus that should pass on its own.  Drink plenty of fluids, follow-up with your OB/GYN regarding the bleeding.  Call today to schedule outpatient follow-up and likely repeat of hCG in the next 48 hours.  Return to the emergency department for severe pain, significant bleeding, new or concerning symptoms.

## 2022-09-25 NOTE — Addendum Note (Signed)
Addended by: Gale Journey on: 09/25/2022 12:50 PM   Modules accepted: Orders

## 2022-09-26 ENCOUNTER — Other Ambulatory Visit: Payer: Self-pay

## 2022-09-26 DIAGNOSIS — O24419 Gestational diabetes mellitus in pregnancy, unspecified control: Secondary | ICD-10-CM

## 2022-09-26 MED ORDER — ACCU-CHEK GUIDE W/DEVICE KIT: PACK | 0 refills | Status: AC

## 2022-09-26 MED ORDER — ACCU-CHEK SOFTCLIX LANCETS MISC: 12 refills | Status: DC

## 2022-09-26 MED ORDER — ACCU-CHEK GUIDE VI STRP: ORAL_STRIP | 12 refills | Status: AC

## 2022-10-02 ENCOUNTER — Encounter: Payer: Self-pay | Admitting: Obstetrics and Gynecology

## 2022-10-02 ENCOUNTER — Ambulatory Visit (INDEPENDENT_AMBULATORY_CARE_PROVIDER_SITE_OTHER): Payer: Medicaid Other | Admitting: Obstetrics and Gynecology

## 2022-10-02 VITALS — BP 123/81 | HR 76 | Wt 223.0 lb

## 2022-10-02 DIAGNOSIS — O24419 Gestational diabetes mellitus in pregnancy, unspecified control: Secondary | ICD-10-CM

## 2022-10-02 DIAGNOSIS — Z3A18 18 weeks gestation of pregnancy: Secondary | ICD-10-CM

## 2022-10-02 DIAGNOSIS — O99210 Obesity complicating pregnancy, unspecified trimester: Secondary | ICD-10-CM

## 2022-10-02 DIAGNOSIS — Z3480 Encounter for supervision of other normal pregnancy, unspecified trimester: Secondary | ICD-10-CM

## 2022-10-02 DIAGNOSIS — O99212 Obesity complicating pregnancy, second trimester: Secondary | ICD-10-CM

## 2022-10-02 MED ORDER — ASPIRIN 81 MG PO TBEC: 81.0000 mg | DELAYED_RELEASE_TABLET | Freq: Every day | ORAL | 2 refills | Status: DC

## 2022-10-02 NOTE — Progress Notes (Signed)
Patient presents for ROB. Reports BG as in the 80s, postprandial BG 100-106. Desires AFP today.

## 2022-10-02 NOTE — Addendum Note (Signed)
Addended by: Tamela Oddi on: 10/02/2022 04:40 PM   Modules accepted: Orders

## 2022-10-02 NOTE — Progress Notes (Signed)
   PRENATAL VISIT NOTE  Subjective:  Becky Cochran is a 32 y.o. [phone] at 19w3dbeing seen today for ongoing prenatal care.  She is currently monitored for the following issues for this high-risk pregnancy and has Supervision of other normal pregnancy, antepartum; Maternal obesity affecting pregnancy, antepartum; and Gestational diabetes mellitus (GDM) affecting pregnancy, antepartum on their problem list.  Patient reports no complaints.  Contractions: Not present. Vag. Bleeding: None.  Movement: Present. Denies leaking of fluid.   The following portions of the patient's history were reviewed and updated as appropriate: allergies, current medications, past family history, past medical history, past social history, past surgical history and problem list.   Objective:   Vitals:   10/02/22 1433  BP: 123/81  Pulse: 76  Weight: 223 lb (101.2 kg)    Fetal Status: Fetal Heart Rate (bpm): 160   Movement: Present     General:  Alert, oriented and cooperative. Patient is in no acute distress.  Skin: Skin is warm and dry. No rash noted.   Cardiovascular: Normal heart rate noted  Respiratory: Normal respiratory effort, no problems with respiration noted  Abdomen: Soft, gravid, appropriate for gestational age.  Pain/Pressure: Absent     Pelvic: Cervical exam deferred        Extremities: Normal range of motion.  Edema: None  Mental Status: Normal mood and affect. Normal behavior. Normal judgment and thought content.   Assessment and Plan:  Pregnancy: [id number] 167w3d. Supervision of other normal pregnancy, antepartum Patient is doing well without complaints AFP today Anatomy ultrasound scheduled on 3/8 - AFP, Serum, Open Spina Bifida  2. Obesity affecting pregnancy, antepartum, unspecified obesity type   3. Gestational diabetes mellitus (GDM) affecting pregnancy, antepartum Patient reports CBG reading for fasting in the 80's and less than 110 postprandial Continue diet control   Patient scheduled for education next week Rx ASA provided Fetal echo ordered  Preterm labor symptoms and general obstetric precautions including but not limited to vaginal bleeding, contractions, leaking of fluid and fetal movement were reviewed in detail with the patient. Please refer to After Visit Summary for other counseling recommendations.   No follow-ups on file.  Future Appointments  Date Time Provider [place]3/03/2023  1:30 PM [facility]  1:45 PM [facility]  2:45 PM [facility] GDM CLASS N[facility] NDM    PeMora BellmanMD

## 2022-10-04 LAB — AFP, SERUM, OPEN SPINA BIFIDA
AFP MoM: 1.76
AFP Value: 69.7 ng/mL
Gest. Age on Collection Date: 18.3 weeks
Maternal Age At EDD: 29.8 yr
OSBR Risk 1 IN: 2814
Test Results:: NEGATIVE
Weight: 223 [lb_av]

## 2022-10-06 ENCOUNTER — Other Ambulatory Visit: Payer: Self-pay | Admitting: *Deleted

## 2022-10-06 ENCOUNTER — Ambulatory Visit: Payer: Medicaid Other | Admitting: *Deleted

## 2022-10-06 ENCOUNTER — Ambulatory Visit: Payer: Medicaid Other | Attending: Obstetrics and Gynecology

## 2022-10-06 ENCOUNTER — Encounter: Payer: Self-pay | Admitting: *Deleted

## 2022-10-06 VITALS — BP 118/67 | HR 62

## 2022-10-06 DIAGNOSIS — O24410 Gestational diabetes mellitus in pregnancy, diet controlled: Secondary | ICD-10-CM

## 2022-10-06 DIAGNOSIS — Z3480 Encounter for supervision of other normal pregnancy, unspecified trimester: Secondary | ICD-10-CM | POA: Diagnosis not present

## 2022-10-06 DIAGNOSIS — O321XX0 Maternal care for breech presentation, not applicable or unspecified: Secondary | ICD-10-CM | POA: Insufficient documentation

## 2022-10-06 DIAGNOSIS — Z3A19 19 weeks gestation of pregnancy: Secondary | ICD-10-CM | POA: Diagnosis not present

## 2022-10-06 DIAGNOSIS — O24419 Gestational diabetes mellitus in pregnancy, unspecified control: Secondary | ICD-10-CM

## 2022-10-06 DIAGNOSIS — O99212 Obesity complicating pregnancy, second trimester: Secondary | ICD-10-CM | POA: Insufficient documentation

## 2022-10-06 DIAGNOSIS — Z363 Encounter for antenatal screening for malformations: Secondary | ICD-10-CM | POA: Insufficient documentation

## 2022-10-11 ENCOUNTER — Ambulatory Visit: Payer: Medicaid Other | Admitting: Registered"

## 2022-10-30 ENCOUNTER — Telehealth (INDEPENDENT_AMBULATORY_CARE_PROVIDER_SITE_OTHER): Payer: Medicaid Other | Admitting: Obstetrics and Gynecology

## 2022-10-30 DIAGNOSIS — O24419 Gestational diabetes mellitus in pregnancy, unspecified control: Secondary | ICD-10-CM

## 2022-10-30 DIAGNOSIS — O99210 Obesity complicating pregnancy, unspecified trimester: Secondary | ICD-10-CM

## 2022-10-30 DIAGNOSIS — Z3A22 22 weeks gestation of pregnancy: Secondary | ICD-10-CM

## 2022-10-30 DIAGNOSIS — Z3480 Encounter for supervision of other normal pregnancy, unspecified trimester: Secondary | ICD-10-CM

## 2022-10-30 NOTE — Progress Notes (Signed)
Pt missed her N&D appt - will work to get rescheduled.

## 2022-10-30 NOTE — Progress Notes (Signed)
Pt is unable to check BP for todays visit.

## 2022-10-30 NOTE — Progress Notes (Signed)
Patient did not log on to her video visit.

## 2022-11-06 ENCOUNTER — Ambulatory Visit: Payer: Medicaid Other | Admitting: *Deleted

## 2022-11-06 ENCOUNTER — Encounter: Payer: Self-pay | Admitting: *Deleted

## 2022-11-06 ENCOUNTER — Ambulatory Visit: Payer: Medicaid Other | Attending: Obstetrics

## 2022-11-06 VITALS — BP 119/73 | HR 60

## 2022-11-06 DIAGNOSIS — O321XX0 Maternal care for breech presentation, not applicable or unspecified: Secondary | ICD-10-CM | POA: Insufficient documentation

## 2022-11-06 DIAGNOSIS — Z3480 Encounter for supervision of other normal pregnancy, unspecified trimester: Secondary | ICD-10-CM | POA: Diagnosis present

## 2022-11-06 DIAGNOSIS — Z363 Encounter for antenatal screening for malformations: Secondary | ICD-10-CM | POA: Diagnosis not present

## 2022-11-06 DIAGNOSIS — E669 Obesity, unspecified: Secondary | ICD-10-CM | POA: Diagnosis not present

## 2022-11-06 DIAGNOSIS — O24410 Gestational diabetes mellitus in pregnancy, diet controlled: Secondary | ICD-10-CM

## 2022-11-06 DIAGNOSIS — O99212 Obesity complicating pregnancy, second trimester: Secondary | ICD-10-CM | POA: Insufficient documentation

## 2022-11-06 DIAGNOSIS — O99210 Obesity complicating pregnancy, unspecified trimester: Secondary | ICD-10-CM | POA: Insufficient documentation

## 2022-11-06 DIAGNOSIS — O24419 Gestational diabetes mellitus in pregnancy, unspecified control: Secondary | ICD-10-CM

## 2022-11-06 DIAGNOSIS — D563 Thalassemia minor: Secondary | ICD-10-CM

## 2022-11-06 DIAGNOSIS — Z3A23 23 weeks gestation of pregnancy: Secondary | ICD-10-CM | POA: Diagnosis not present

## 2022-11-07 ENCOUNTER — Other Ambulatory Visit: Payer: Self-pay | Admitting: *Deleted

## 2022-11-07 DIAGNOSIS — O24419 Gestational diabetes mellitus in pregnancy, unspecified control: Secondary | ICD-10-CM

## 2022-11-09 ENCOUNTER — Encounter: Payer: Medicaid Other | Attending: Obstetrics and Gynecology | Admitting: Registered"

## 2022-11-09 ENCOUNTER — Telehealth (INDEPENDENT_AMBULATORY_CARE_PROVIDER_SITE_OTHER): Payer: Medicaid Other | Admitting: Registered"

## 2022-11-09 DIAGNOSIS — O24419 Gestational diabetes mellitus in pregnancy, unspecified control: Secondary | ICD-10-CM

## 2022-11-09 DIAGNOSIS — O99212 Obesity complicating pregnancy, second trimester: Secondary | ICD-10-CM | POA: Diagnosis not present

## 2022-11-09 DIAGNOSIS — Z713 Dietary counseling and surveillance: Secondary | ICD-10-CM | POA: Diagnosis not present

## 2022-11-09 DIAGNOSIS — E669 Obesity, unspecified: Secondary | ICD-10-CM | POA: Insufficient documentation

## 2022-11-09 DIAGNOSIS — Z3A23 23 weeks gestation of pregnancy: Secondary | ICD-10-CM

## 2022-11-09 NOTE — Progress Notes (Signed)
Virtual Visit via Video Note  I connected with Becky Cochran on 11/09/22 at  3:15 PM EDT by a video enabled telemedicine application and verified that I am speaking with the correct person using two identifiers.  Location: Patient: in car at work Provider: Therapist, music for [employer]   I discussed the limitations of evaluation and management by telemedicine and the availability of in person appointments. The patient expressed understanding and agreed to proceed.  Patient was seen for Gestational Diabetes self-management Start time 1515 and End time 1610   Estimated due date: 03/02/23; [redacted]w[redacted]d  Clinical: Medications: reviewed Medical History: GDM 8 yrs ago diet controlled Labs: A1c 5.7%    Lab Results  Component Value Date   HGBA1C 5.7 (H) 08/25/2022   Dietary and Lifestyle History: Pt states she tries to walk after lunch to help control blood, enjoys walking. Pt states her mom has diabetes.  Pt reports having GDM education 8 yrs ago and had a few questions about some of the details on carbs servings that she didn't remember.  Physical Activity: Goes to gym before work, walks 2 1/2 - 3 miles per day at work (promoted by employer) Stress: low 3/10 Sleep: 8 hrs  24 hr Recall:  First Meal: overnight oats, greek yogurt, almond milk, 1 c coffee half n half cream Or English muffin, eggs OR 1/2 banana whole wheat toast, almond butter, honey drizzle if banana is not ripe. Snack: none  Second meal: grilled chicken breast, spaghetti Snack: protein shake OR little snack that are in the office Third meal: 7-8 pm grilled chicken breast, spaghetti Snack: none (has heartburn at night) Beverages: almond milk, coffee, ~80 oz water  NUTRITION INTERVENTION  Nutrition education (E-1) on the following topics:   Initial Follow-up  [x]  []  Definition of Gestational Diabetes []  []  Why dietary management is important in controlling blood glucose [x]  []  Effects each nutrient has on blood  glucose levels [x]  []  Simple carbohydrates vs complex carbohydrates [x]  []  Fluid intake [x]  []  Creating a balanced meal plan [x]  []  Carbohydrate counting  [x]  []  When to check blood glucose levels [x]  []  Proper blood glucose monitoring techniques [x]  []  Effect of stress and stress reduction techniques  [x]  []  Exercise effect on blood glucose levels, appropriate exercise during pregnancy [x]  []  Importance of limiting caffeine and abstaining from alcohol and smoking []  []  Medications used for blood sugar control during pregnancy [x]  []  Hypoglycemia and rule of 15 [x]  []  Postpartum self care  Patient has a meter prior to visit. Patient is testing pre breakfast and 2 hours after each meal. FBS: 80s Postprandial: 2 hrs 65 typically 80-110 only once was 120.  Patient instructed to monitor glucose levels: FBS: 60 - ? 95 mg/dL; 2 hour: ? 748 mg/dL  Patient received handouts: Nutrition Diabetes and Pregnancy Carbohydrate Counting List  Patient will be seen for follow-up as needed.  Follow Up Instructions:  I discussed the assessment and treatment plan with the patient. The patient was provided an opportunity to ask questions and all were answered. The patient agreed with the plan and demonstrated an understanding of the instructions.   The patient was advised to call back or seek an in-person evaluation if the symptoms worsen or if the condition fails to improve as anticipated.  I provided 55 minutes of non-face-to-face time during this encounter.  Heywood Bene, RD, LDN, CDCES

## 2022-11-27 ENCOUNTER — Encounter: Payer: Self-pay | Admitting: Obstetrics and Gynecology

## 2022-11-27 ENCOUNTER — Ambulatory Visit (INDEPENDENT_AMBULATORY_CARE_PROVIDER_SITE_OTHER): Payer: Medicaid Other | Admitting: Obstetrics and Gynecology

## 2022-11-27 VITALS — BP 119/80 | HR 76 | Wt 232.0 lb

## 2022-11-27 DIAGNOSIS — Z3A26 26 weeks gestation of pregnancy: Secondary | ICD-10-CM

## 2022-11-27 DIAGNOSIS — O24419 Gestational diabetes mellitus in pregnancy, unspecified control: Secondary | ICD-10-CM

## 2022-11-27 DIAGNOSIS — O99210 Obesity complicating pregnancy, unspecified trimester: Secondary | ICD-10-CM

## 2022-11-27 DIAGNOSIS — O99212 Obesity complicating pregnancy, second trimester: Secondary | ICD-10-CM

## 2022-11-27 DIAGNOSIS — Z3480 Encounter for supervision of other normal pregnancy, unspecified trimester: Secondary | ICD-10-CM

## 2022-11-27 NOTE — Progress Notes (Signed)
Pt states she has echo scheduled in the next few weeks.

## 2022-11-27 NOTE — Progress Notes (Signed)
   PRENATAL VISIT NOTE  Subjective:  Becky Cochran is a 32 y.o. [phone] at [redacted]w[redacted]d being seen today for ongoing prenatal care.  She is currently monitored for the following issues for this high-risk pregnancy and has Supervision of other normal pregnancy, antepartum; Maternal obesity affecting pregnancy, antepartum; and Gestational diabetes mellitus (GDM) affecting pregnancy, antepartum on their problem list.  Patient reports no complaints.  Contractions: Not present. Vag. Bleeding: None.  Movement: Present. Denies leaking of fluid.   The following portions of the patient's history were reviewed and updated as appropriate: allergies, current medications, past family history, past medical history, past social history, past surgical history and problem list.   Objective:   Vitals:   11/27/22 1455  BP: 119/80  Pulse: 76  Weight: 232 lb (105.2 kg)    Fetal Status: Fetal Heart Rate (bpm): 160   Movement: Present     General:  Alert, oriented and cooperative. Patient is in no acute distress.  Skin: Skin is warm and dry. No rash noted.   Cardiovascular: Normal heart rate noted  Respiratory: Normal respiratory effort, no problems with respiration noted  Abdomen: Soft, gravid, appropriate for gestational age.  Pain/Pressure: Absent     Pelvic: Cervical exam deferred        Extremities: Normal range of motion.     Mental Status: Normal mood and affect. Normal behavior. Normal judgment and thought content.   Assessment and Plan:  Pregnancy: [id number] at [redacted]w[redacted]d 1. Supervision of other normal pregnancy, antepartum Patient is doing well Third trimester labs today  2. Gestational diabetes mellitus (GDM) affecting pregnancy, antepartum Patient did not bring log but reports all values within range Follow up growth ultrasound 5/8 Fetal echo has been rescheduled   3. Obesity affecting pregnancy, antepartum, unspecified obesity type Patient is not taking ASA  4. [redacted] weeks gestation of  pregnancy   Preterm labor symptoms and general obstetric precautions including but not limited to vaginal bleeding, contractions, leaking of fluid and fetal movement were reviewed in detail with the patient. Please refer to After Visit Summary for other counseling recommendations.   Return in about 2 weeks (around 12/11/2022) for in person, ROB, High risk.  Future Appointments  Date Time Provider Department Center  12/06/2022  2:30 PM [facility]URSE [facility]  12/06/2022  2:45 PM [facility] US5 [facility]    Catalina Antigua, MD

## 2022-11-28 LAB — CBC
Hematocrit: 32.6 % — ABNORMAL LOW (ref 34.0–46.6)
Hemoglobin: 10.4 g/dL — ABNORMAL LOW (ref 11.1–15.9)
MCH: 27.3 pg (ref 26.6–33.0)
MCHC: 31.9 g/dL (ref 31.5–35.7)
MCV: 86 fL (ref 79–97)
Platelets: 297 10*3/uL (ref 150–450)
RBC: 3.81 x10E6/uL (ref 3.77–5.28)
RDW: 12.9 % (ref 11.7–15.4)
WBC: 10.9 10*3/uL — ABNORMAL HIGH (ref 3.4–10.8)

## 2022-11-28 LAB — RPR: RPR Ser Ql: NONREACTIVE

## 2022-11-28 LAB — HIV ANTIBODY (ROUTINE TESTING W REFLEX): HIV Screen 4th Generation wRfx: NONREACTIVE

## 2022-12-06 ENCOUNTER — Ambulatory Visit: Payer: Medicaid Other | Admitting: *Deleted

## 2022-12-06 ENCOUNTER — Ambulatory Visit: Payer: Medicaid Other | Attending: Obstetrics and Gynecology

## 2022-12-06 VITALS — BP 122/66 | HR 75

## 2022-12-06 DIAGNOSIS — O285 Abnormal chromosomal and genetic finding on antenatal screening of mother: Secondary | ICD-10-CM | POA: Diagnosis not present

## 2022-12-06 DIAGNOSIS — O24410 Gestational diabetes mellitus in pregnancy, diet controlled: Secondary | ICD-10-CM | POA: Diagnosis not present

## 2022-12-06 DIAGNOSIS — O99213 Obesity complicating pregnancy, third trimester: Secondary | ICD-10-CM | POA: Diagnosis not present

## 2022-12-06 DIAGNOSIS — O24419 Gestational diabetes mellitus in pregnancy, unspecified control: Secondary | ICD-10-CM | POA: Diagnosis not present

## 2022-12-06 DIAGNOSIS — Z3A28 28 weeks gestation of pregnancy: Secondary | ICD-10-CM

## 2022-12-06 DIAGNOSIS — D563 Thalassemia minor: Secondary | ICD-10-CM

## 2022-12-06 DIAGNOSIS — E669 Obesity, unspecified: Secondary | ICD-10-CM

## 2022-12-07 ENCOUNTER — Other Ambulatory Visit: Payer: Self-pay | Admitting: *Deleted

## 2022-12-07 DIAGNOSIS — O24410 Gestational diabetes mellitus in pregnancy, diet controlled: Secondary | ICD-10-CM

## 2022-12-07 DIAGNOSIS — O99212 Obesity complicating pregnancy, second trimester: Secondary | ICD-10-CM

## 2022-12-11 ENCOUNTER — Telehealth (INDEPENDENT_AMBULATORY_CARE_PROVIDER_SITE_OTHER): Payer: Medicaid Other | Admitting: Obstetrics and Gynecology

## 2022-12-11 DIAGNOSIS — O24419 Gestational diabetes mellitus in pregnancy, unspecified control: Secondary | ICD-10-CM

## 2022-12-11 DIAGNOSIS — O99210 Obesity complicating pregnancy, unspecified trimester: Secondary | ICD-10-CM

## 2022-12-11 DIAGNOSIS — Z3A28 28 weeks gestation of pregnancy: Secondary | ICD-10-CM

## 2022-12-11 DIAGNOSIS — O99213 Obesity complicating pregnancy, third trimester: Secondary | ICD-10-CM

## 2022-12-11 DIAGNOSIS — E669 Obesity, unspecified: Secondary | ICD-10-CM

## 2022-12-11 DIAGNOSIS — Z3480 Encounter for supervision of other normal pregnancy, unspecified trimester: Secondary | ICD-10-CM

## 2022-12-11 NOTE — Progress Notes (Signed)
OBSTETRICS PRENATAL VIRTUAL VISIT ENCOUNTER NOTE  Provider [facility]   Patient location: Home  I connected with Becky Cochran on 12/11/22 at  1:50 PM EDT by MyChart Video Encounter and verified that I am speaking with the correct person using two identifiers. I discussed the limitations, risks, security and privacy concerns of performing an evaluation and management service virtually and the availability of in person appointments. I also discussed with the patient that there may be a patient responsible charge related to this service. The patient expressed understanding and agreed to proceed. Subjective:  Becky Cochran is a 32 y.o. [phone] at [redacted]w[redacted]d being seen today for ongoing prenatal care.  She is currently monitored for the following issues for this high-risk pregnancy and has Supervision of other normal pregnancy, antepartum; Maternal obesity affecting pregnancy, antepartum; and Gestational diabetes mellitus (GDM) affecting pregnancy, antepartum on their problem list.  Patient reports no complaints.  Contractions: Not present. Vag. Bleeding: None.  Movement: Present. Denies any leaking of fluid.   The following portions of the patient's history were reviewed and updated as appropriate: allergies, current medications, past family history, past medical history, past social history, past surgical history and problem list.   Objective:  There were no vitals filed for this visit.  Fetal Status:     Movement: Present     General:  Alert, oriented and cooperative. Patient is in no acute distress.  Respiratory: Normal respiratory effort, no problems with respiration noted  Mental Status: Normal mood and affect. Normal behavior. Normal judgment and thought content.  Rest of physical exam deferred due to type of encounter  Imaging: [place] MFM OB FOLLOW UP  Result Date: 12/07/2022 ----------------------------------------------------------------------   OBSTETRICS REPORT                       (Signed Final 12/07/2022 10:45 am) ---------------------------------------------------------------------- Patient Info  ID #:       [id number]                          D.O.B.:  11/10/1990 (31 yrs)  Name:       Becky Cochran                   Visit Date: 12/06/2022 02:56 pm ---------------------------------------------------------------------- Performed By  Attending:        Noralee Space MD        Ref. Address:     [address]                                                             [phone]  [place]                                                             [address]  Performed By:     Tommie Raymond BS,       [facility]                                [facility]  Referred By:      [facility] ---------------------------------------------------------------------- Orders  #  Description                           Code        Ordered By  1  [place] MFM OB FOLLOW UP                   [phone]    [facility] ----------------------------------------------------------------------  #  Order #                     Accession #                Episode #  1  [id number]                   [id number]                 [id number] ---------------------------------------------------------------------- Indications  [redacted] weeks gestation of pregnancy                Z3A.28  Gestational diabetes in pregnancy, diet        O24.410  controlled  Obesity complicating pregnancy, third          O99.213  trimester (BMI 36)  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 Cord Insertion:  Previously visualized  Amniotic Fluid  AFI FV:      Within normal limits  AFI Sum(cm)     %Tile       Largest Pocket(cm)  20.33           81          7.98  RUQ(cm)       RLQ(cm)       LUQ(cm)        LLQ(cm)  3.77          7.98          3.55           5.02 ---------------------------------------------------------------------- Biometry  BPD:      71.9  mm     G. Age:  28w 6d         67  %    CI:        74.01   %    70 - 86                                                          FL/HC:      19.4   %    18.8 - 20.6  HC:      265.4  mm     G. Age:  28w 6d         47  %    HC/AC:      1.08        1.05 - 1.21  AC:      245.8  mm     G. Age:  28w 6d         68  %    FL/BPD:     71.6   %    71 - 87  FL:       51.5  mm     G. Age:  27w 4d         22  %    FL/AC:      21.0   %    20 - 24  LV:        2.7  mm  Est. FW:    1222  gm    2 lb 11 oz      53  % ---------------------------------------------------------------------- OB History  Blood Type:   A+  Maternal Racial/Ethnic Group:   Black (non-Hispanic)  Gravidity:    5         Term:   3        Prem:   0  Ectopic:      0        Living:  3 ---------------------------------------------------------------------- Gestational Age  LMP:           27w 5d        Date:  05/26/22                 EDD:   03/02/23  U/S Today:     28w 4d                                        EDD:   02/24/23  Best:          [name]  Det. By:  U/S C R L  (08/09/22)    EDD:   02/28/23 ---------------------------------------------------------------------- Anatomy  Cranium:               Appears normal         Aortic Arch:            Previously seen  Cavum:                 Appears normal         Ductal Arch:            Previously seen  Ventricles:             Appears normal         Diaphragm:              Appears normal  Choroid Plexus:        Appears normal         Stomach:                Appears normal, left                                                                        sided  Cerebellum:            Previously seen        Abdomen:                Previously seen  Posterior Fossa:       Previously seen        Abdominal Wall:         Previously seen  Nuchal Fold:           Previously seen        Cord Vessels:           Previously seen  Face:                  Orbits and profile     Kidneys:                Appear normal                         previously seen  Lips:                  Previously seen        Bladder:                Appears normal  Thoracic:              Appears normal         Spine:                  Previously seen  Heart:                 Appears normal         Upper Extremities:      Previously seen                         (4CH, axis, and  situs)  RVOT:                  Previously seen        Lower Extremities:      Previously seen  LVOT:                  Previously seen  Other:  Female gender, Lenses, VC, 3VV and 3VTV visualized. Heels/feet and          open hands/5th digits visualized. Nasal bone, maxilla, mandible and          falx visualized ---------------------------------------------------------------------- Cervix Uterus Adnexa  Cervix  Length:              5  cm.  Normal appearance by transabdominal scan  Uterus  No abnormality visualized.  Right Ovary  Size(cm)     2.77   x   2.54   x  2.16      Vol(ml): 7.96  Within normal limits.  Left Ovary  Size(cm)     2.55   x   2.19   x  2.19      Vol(ml): 6.4  Within normal limits.  Cul De Sac  No free fluid seen.  Adnexa  No abnormality visualized ---------------------------------------------------------------------- Impression  Gestational diabetes. Well-controlled on diet .  Fetal growth is appropriate for gestational age .Amniotic fluid  is normal and good fetal  activity is seen . ---------------------------------------------------------------------- Recommendations  -An appointment was made for her to return in 4 weeks for  fetal growth assessment. ----------------------------------------------------------------------                 Noralee Space, MD Electronically Signed Final Report   12/07/2022 10:45 am ----------------------------------------------------------------------   Assessment and Plan:  Pregnancy: [id number] at [redacted]w[redacted]d 1. [redacted] weeks gestation of pregnancy   2. Gestational diabetes mellitus (GDM) affecting pregnancy, antepartum FBS: 85-93 PPBS: 125 avg, states they have been creeping up Pt advised to watch diet closely, If more elevated pt may need oral medication  3. Supervision of other normal pregnancy, antepartum Continue routine prenatal care Pt advised it is still ok to start baby ASA as she has risk factors  4. Obesity affecting pregnancy, antepartum, unspecified obesity type   Preterm labor symptoms and general obstetric precautions including but not limited to vaginal bleeding, contractions, leaking of fluid and fetal movement were reviewed in detail with the patient. I discussed the assessment and treatment plan with the patient. The patient was provided an opportunity to ask questions and all were answered. The patient agreed with the plan and demonstrated an understanding of the instructions. The patient was advised to call back or seek an in-person office evaluation/go to [facility] at [facility] for any urgent or concerning symptoms. Please refer to After Visit Summary for other counseling recommendations.   I provided 10 minutes of face-to-face time during this encounter.  Return in about 2 weeks (around 12/25/2022) for [facility], in person, 3rd trim labs.  Future Appointments  Date Time Provider Department Center  01/03/2023  3:30 PM [facility] NURSE [facility]  01/03/2023  3:45 PM [facility] US1 [facility]    Warden Fillers, MD Center for [employer], [facility]

## 2022-12-11 NOTE — Progress Notes (Signed)
Virtual visit 28.[redacted] wks GA GDM/ Can share values No concerns Has not taken BP, instructed to take BP and log in Bab[employer] App.

## 2023-01-03 ENCOUNTER — Ambulatory Visit: Payer: Medicaid Other | Attending: Obstetrics and Gynecology

## 2023-01-03 ENCOUNTER — Ambulatory Visit: Payer: Medicaid Other | Admitting: *Deleted

## 2023-01-03 VITALS — BP 132/73 | HR 79

## 2023-01-03 DIAGNOSIS — O24410 Gestational diabetes mellitus in pregnancy, diet controlled: Secondary | ICD-10-CM | POA: Diagnosis present

## 2023-01-03 DIAGNOSIS — O99212 Obesity complicating pregnancy, second trimester: Secondary | ICD-10-CM

## 2023-01-03 DIAGNOSIS — O24419 Gestational diabetes mellitus in pregnancy, unspecified control: Secondary | ICD-10-CM | POA: Diagnosis present

## 2023-01-03 DIAGNOSIS — D563 Thalassemia minor: Secondary | ICD-10-CM | POA: Diagnosis not present

## 2023-01-03 DIAGNOSIS — O99213 Obesity complicating pregnancy, third trimester: Secondary | ICD-10-CM

## 2023-01-03 DIAGNOSIS — O285 Abnormal chromosomal and genetic finding on antenatal screening of mother: Secondary | ICD-10-CM

## 2023-01-03 DIAGNOSIS — Z3A32 32 weeks gestation of pregnancy: Secondary | ICD-10-CM

## 2023-01-03 DIAGNOSIS — E669 Obesity, unspecified: Secondary | ICD-10-CM

## 2023-01-04 ENCOUNTER — Ambulatory Visit (INDEPENDENT_AMBULATORY_CARE_PROVIDER_SITE_OTHER): Payer: Medicaid Other | Admitting: Family Medicine

## 2023-01-04 ENCOUNTER — Encounter: Payer: Self-pay | Admitting: Family Medicine

## 2023-01-04 ENCOUNTER — Other Ambulatory Visit: Payer: Self-pay | Admitting: *Deleted

## 2023-01-04 VITALS — BP 118/76 | HR 65 | Wt 231.8 lb

## 2023-01-04 DIAGNOSIS — O99213 Obesity complicating pregnancy, third trimester: Secondary | ICD-10-CM

## 2023-01-04 DIAGNOSIS — O24419 Gestational diabetes mellitus in pregnancy, unspecified control: Secondary | ICD-10-CM

## 2023-01-04 DIAGNOSIS — O99210 Obesity complicating pregnancy, unspecified trimester: Secondary | ICD-10-CM

## 2023-01-04 DIAGNOSIS — Z3A31 31 weeks gestation of pregnancy: Secondary | ICD-10-CM

## 2023-01-04 DIAGNOSIS — Z3480 Encounter for supervision of other normal pregnancy, unspecified trimester: Secondary | ICD-10-CM

## 2023-01-04 NOTE — Progress Notes (Signed)
   PRENATAL VISIT NOTE  Subjective:  Becky Cochran is a 32 y.o. [phone] at [redacted]w[redacted]d being seen today for ongoing prenatal care.  She is currently monitored for the following issues for this high-risk pregnancy and has Supervision of other normal pregnancy, antepartum; Maternal obesity affecting pregnancy, antepartum; and Gestational diabetes mellitus (GDM) affecting pregnancy, antepartum on their problem list.  Patient reports no bleeding, no contractions, no cramping, and no leaking.  Contractions: Not present. Vag. Bleeding: None.  Movement: Present. Denies leaking of fluid.   The following portions of the patient's history were reviewed and updated as appropriate: allergies, current medications, past family history, past medical history, past social history, past surgical history and problem list.   Objective:   Vitals:   01/04/23 1538  BP: 118/76  Pulse: 65  Weight: 231 lb 12.8 oz (105.1 kg)    Fetal Status: Fetal Heart Rate (bpm): 149   Movement: Present     General:  Alert, oriented and cooperative. Patient is in no acute distress.  Skin: Skin is warm and dry. No rash noted.   Cardiovascular: Normal heart rate noted  Respiratory: Normal respiratory effort, no problems with respiration noted  Abdomen: Soft, gravid, appropriate for gestational age.  Pain/Pressure: Absent     Pelvic: Cervical exam deferred        Extremities: Normal range of motion.  Edema: None  Mental Status: Normal mood and affect. Normal behavior. Normal judgment and thought content.   Assessment and Plan:  Pregnancy: [id number] at [redacted]w[redacted]d 1. Supervision of other normal pregnancy, antepartum Continue routine prenatal care  2. Gestational diabetes mellitus (GDM) affecting pregnancy, antepartum 15/50 CBGs elevated.  Discussed continued dietary changes.  Will not start medications at this time.  Patient provided with blood glucose log paperwork and she will bring it next visit.  If worsening glucose control may need  to be started on metformin  3. Obesity affecting pregnancy, antepartum, unspecified obesity type  4. [redacted] weeks gestation of pregnancy  Preterm labor symptoms and general obstetric precautions including but not limited to vaginal bleeding, contractions, leaking of fluid and fetal movement were reviewed in detail with the patient. Please refer to After Visit Summary for other counseling recommendations.   No follow-ups on file.  Future Appointments  Date Time Provider Department Center  02/07/2023  3:30 PM [facility] NURSE [facility]  02/07/2023  3:45 PM [facility] US4 [facility]    Celedonio Savage, MD

## 2023-01-04 NOTE — Progress Notes (Signed)
Pt presents for ROB visit. No concerns.  

## 2023-01-19 ENCOUNTER — Encounter: Payer: Self-pay | Admitting: Student

## 2023-01-19 ENCOUNTER — Ambulatory Visit (INDEPENDENT_AMBULATORY_CARE_PROVIDER_SITE_OTHER): Payer: Medicaid Other | Admitting: Student

## 2023-01-19 VITALS — BP 113/76 | HR 76 | Wt 230.6 lb

## 2023-01-19 DIAGNOSIS — Z3A34 34 weeks gestation of pregnancy: Secondary | ICD-10-CM

## 2023-01-19 DIAGNOSIS — Z3480 Encounter for supervision of other normal pregnancy, unspecified trimester: Secondary | ICD-10-CM

## 2023-01-19 DIAGNOSIS — O99210 Obesity complicating pregnancy, unspecified trimester: Secondary | ICD-10-CM

## 2023-01-19 DIAGNOSIS — O24419 Gestational diabetes mellitus in pregnancy, unspecified control: Secondary | ICD-10-CM

## 2023-01-19 MED ORDER — METFORMIN HCL 500 MG PO TABS: 1000.0000 mg | ORAL_TABLET | Freq: Two times a day (BID) | ORAL | 1 refills | Status: DC

## 2023-01-19 NOTE — Progress Notes (Signed)
Pt presents for ROB visit. Pt c/o pain in the groin that makes it difficult to walk. Advised pt to try maternity support band. No other concerns.

## 2023-01-19 NOTE — Progress Notes (Unsigned)
   PRENATAL VISIT NOTE  Subjective:  Becky Cochran is a 32 y.o. [id number] at [redacted]w[redacted]d being seen today for ongoing prenatal care.  She is currently monitored for the following issues for this high-risk pregnancy and has Supervision of other normal pregnancy, antepartum; Maternal obesity affecting pregnancy, antepartum; and Gestational diabetes mellitus (GDM) affecting pregnancy, antepartum on their problem list.  Patient reports  pelvic pain with ambulation .  Contractions: Irritability. Vag. Bleeding: None.  Movement: Present. Denies leaking of fluid.   The following portions of the patient's history were reviewed and updated as appropriate: allergies, current medications, past family history, past medical history, past social history, past surgical history and problem list.   Objective:   Vitals:   01/19/23 0914  BP: 113/76  Pulse: 76  Weight: 230 lb 9.6 oz (104.6 kg)    Fetal Status: Fetal Heart Rate (bpm): 145   Movement: Present     General:  Alert, oriented and cooperative. Patient is in no acute distress.  Skin: Skin is warm and dry. No rash noted.   Cardiovascular: Normal heart rate noted  Respiratory: Normal respiratory effort, no problems with respiration noted  Abdomen: Soft, gravid, appropriate for gestational age.  Pain/Pressure: Absent     Pelvic: Cervical exam deferred        Extremities: Normal range of motion.  Edema: None  Mental Status: Normal mood and affect. Normal behavior. Normal judgment and thought content.   Assessment and Plan:  Pregnancy: [id number] at [redacted]w[redacted]d 1. Supervision of other normal pregnancy, antepartum - Frequent and vigorous fetal movement  - Urine Cx collected today, Patient has not had one in this pregnancy - Encouraged to balance rest and light activity to support pelvic floor changes. Discussed option for postpartum PT if discomfort continues  2. [redacted] weeks gestation of pregnancy - Follow-up in 2 weeks  3. Gestational diabetes mellitus (GDM)  affecting pregnancy, antepartum - 15/50 outside normal limits at previous visit. Today's values :  Fasting : 89-101, Abnormal Readings: 96, 100,101x2,  Postprandial : 96 - 155, Abnormal Readings: 123,124, 155, 136, 128, 134 x4, 136, 140, 128, 126 - Dr. Clearance Coots consulted on abnormal readings and recommended 1000 mg Metformin BID - f/u growth scan scheduled  4. Obesity affecting pregnancy, antepartum, unspecified obesity type - f/u growth scan scheduled  Preterm labor symptoms and general obstetric precautions including but not limited to vaginal bleeding, contractions, leaking of fluid and fetal movement were reviewed in detail with the patient. Please refer to After Visit Summary for other counseling recommendations.   Return in about 2 weeks (around 02/02/2023) for LOB/GBS, IN-PERSON.  Future Appointments  Date Time Provider Department Center  02/05/2023  2:30 PM Hurshel Party, CNM [facility] None  02/07/2023  3:30 PM [facility] NURSE [facility]  02/07/2023  3:45 PM [facility] US4 [facility]    Corlis Hove, NP

## 2023-01-21 LAB — CULTURE, OB URINE

## 2023-01-21 LAB — URINE CULTURE, OB REFLEX

## 2023-02-02 ENCOUNTER — Telehealth: Payer: Self-pay

## 2023-02-02 NOTE — Telephone Encounter (Signed)
Returned call, advised of urine results

## 2023-02-05 ENCOUNTER — Encounter: Payer: Self-pay | Admitting: Advanced Practice Midwife

## 2023-02-05 ENCOUNTER — Other Ambulatory Visit (HOSPITAL_COMMUNITY)
Admission: RE | Admit: 2023-02-05 | Discharge: 2023-02-05 | Disposition: A | Discharge status: Home / Self Care | Payer: Medicaid Other | Source: Ambulatory Visit | Attending: Advanced Practice Midwife | Admitting: Advanced Practice Midwife

## 2023-02-05 ENCOUNTER — Ambulatory Visit (INDEPENDENT_AMBULATORY_CARE_PROVIDER_SITE_OTHER): Payer: Medicaid Other | Admitting: Advanced Practice Midwife

## 2023-02-05 VITALS — BP 125/83 | HR 77 | Wt 233.0 lb

## 2023-02-05 DIAGNOSIS — Z3480 Encounter for supervision of other normal pregnancy, unspecified trimester: Secondary | ICD-10-CM | POA: Diagnosis present

## 2023-02-05 DIAGNOSIS — O24419 Gestational diabetes mellitus in pregnancy, unspecified control: Secondary | ICD-10-CM

## 2023-02-05 DIAGNOSIS — Z3A36 36 weeks gestation of pregnancy: Secondary | ICD-10-CM

## 2023-02-05 DIAGNOSIS — N898 Other specified noninflammatory disorders of vagina: Secondary | ICD-10-CM

## 2023-02-05 NOTE — Progress Notes (Signed)
   PRENATAL VISIT NOTE  Subjective:  Becky Cochran is a 32 y.o. [phone] at [redacted]w[redacted]d being seen today for ongoing prenatal care.  She is currently monitored for the following issues for this high-risk pregnancy and has Supervision of other normal pregnancy, antepartum; Maternal obesity affecting pregnancy, antepartum; and Gestational diabetes mellitus (GDM) affecting pregnancy, antepartum on their problem list.  Patient reports  vaginal itching/discharge .  Contractions: Irritability. Vag. Bleeding: None.  Movement: Present. Denies leaking of fluid.   The following portions of the patient's history were reviewed and updated as appropriate: allergies, current medications, past family history, past medical history, past social history, past surgical history and problem list.   Objective:   Vitals:   02/05/23 1432  BP: 125/83  Pulse: 77  Weight: 233 lb (105.7 kg)    Fetal Status: Fetal Heart Rate (bpm): 141 Fundal Height: 36 cm Movement: Present     General:  Alert, oriented and cooperative. Patient is in no acute distress.  Skin: Skin is warm and dry. No rash noted.   Cardiovascular: Normal heart rate noted  Respiratory: Normal respiratory effort, no problems with respiration noted  Abdomen: Soft, gravid, appropriate for gestational age.  Pain/Pressure: Absent     Pelvic: Cervical exam deferred Dilation: Fingertip Effacement (%): 0 Station: -3  Extremities: Normal range of motion.  Edema: None  Mental Status: Normal mood and affect. Normal behavior. Normal judgment and thought content.   Assessment and Plan:  Pregnancy: [id number] at [redacted]w[redacted]d 1. Supervision of other normal pregnancy, antepartum --Anticipatory guidance about next visits/weeks of pregnancy given.  --Pt interested in low intervention, wanted waterbirth but aware that with GDM on medications, she is not a candidate.  Questions answered about use of hydrotherapy in the shower, use of birth ball, position changing/walking, etc.  Pt  prefers to see midwives for office visits and for delivery, aware that this is not always possible.  --Reviewed labor readiness with patient including the [facility] [employer], evening primrose oil, and raspberry leaf tea.    2. Gestational diabetes mellitus (GDM) affecting pregnancy, antepartum --Much better control on Metformin 1000 mg BID --Fasting 82-92, PP 82-108 --Continue current course  3. Vaginal itching --using 7 night cream currently, so no testing for yeast today --Symptoms similar to previous yeast infections --Rx for Diflucan to take if pt still has symptoms after 7 day course  4. [redacted] weeks gestation of pregnancy   Term labor symptoms and general obstetric precautions including but not limited to vaginal bleeding, contractions, leaking of fluid and fetal movement were reviewed in detail with the patient. Please refer to After Visit Summary for other counseling recommendations.   Return in about 1 week (around 02/12/2023) for LOB, Midwife preferred.  Future Appointments  Date Time Provider Department Center  02/07/2023  3:30 PM [facility] NURSE [facility]  02/07/2023  3:45 PM [facility] US4 [facility]  02/14/2023  3:30 PM Sue Lush, FNP [facility] None  02/21/2023  3:30 PM Sue Lush, FNP [facility] None    Sharen Counter, CNM

## 2023-02-05 NOTE — Progress Notes (Signed)
Pt presents for ROB visit. Pt c/o vaginal itch and discharge. Treated with OTC meds. No concerns.

## 2023-02-06 LAB — CERVICOVAGINAL ANCILLARY ONLY
Chlamydia: NEGATIVE
Comment: NEGATIVE
Comment: NEGATIVE
Comment: NORMAL
Neisseria Gonorrhea: NEGATIVE
Trichomonas: NEGATIVE

## 2023-02-07 ENCOUNTER — Ambulatory Visit: Payer: Medicaid Other | Admitting: *Deleted

## 2023-02-07 ENCOUNTER — Other Ambulatory Visit: Payer: Self-pay | Admitting: Obstetrics and Gynecology

## 2023-02-07 ENCOUNTER — Ambulatory Visit: Payer: Medicaid Other | Attending: Obstetrics and Gynecology

## 2023-02-07 VITALS — BP 125/69 | HR 61

## 2023-02-07 DIAGNOSIS — O99013 Anemia complicating pregnancy, third trimester: Secondary | ICD-10-CM | POA: Diagnosis not present

## 2023-02-07 DIAGNOSIS — O24419 Gestational diabetes mellitus in pregnancy, unspecified control: Secondary | ICD-10-CM | POA: Insufficient documentation

## 2023-02-07 DIAGNOSIS — O24415 Gestational diabetes mellitus in pregnancy, controlled by oral hypoglycemic drugs: Secondary | ICD-10-CM

## 2023-02-07 DIAGNOSIS — D563 Thalassemia minor: Secondary | ICD-10-CM

## 2023-02-07 DIAGNOSIS — O99213 Obesity complicating pregnancy, third trimester: Secondary | ICD-10-CM

## 2023-02-07 DIAGNOSIS — Z3A37 37 weeks gestation of pregnancy: Secondary | ICD-10-CM

## 2023-02-07 DIAGNOSIS — E669 Obesity, unspecified: Secondary | ICD-10-CM | POA: Diagnosis not present

## 2023-02-09 LAB — CULTURE, BETA STREP (GROUP B ONLY): Strep Gp B Culture: NEGATIVE

## 2023-02-14 ENCOUNTER — Ambulatory Visit: Payer: Medicaid Other | Admitting: Student

## 2023-02-14 ENCOUNTER — Ambulatory Visit: Payer: Medicaid Other | Attending: Maternal & Fetal Medicine | Admitting: *Deleted

## 2023-02-14 VITALS — BP 101/61 | HR 62 | Wt 232.6 lb

## 2023-02-14 DIAGNOSIS — O24415 Gestational diabetes mellitus in pregnancy, controlled by oral hypoglycemic drugs: Secondary | ICD-10-CM

## 2023-02-14 DIAGNOSIS — O24419 Gestational diabetes mellitus in pregnancy, unspecified control: Secondary | ICD-10-CM

## 2023-02-14 DIAGNOSIS — Z3480 Encounter for supervision of other normal pregnancy, unspecified trimester: Secondary | ICD-10-CM

## 2023-02-14 DIAGNOSIS — Z3A37 37 weeks gestation of pregnancy: Secondary | ICD-10-CM | POA: Diagnosis not present

## 2023-02-14 NOTE — Progress Notes (Signed)
Pt reports fetal movement, denies pain.  Reports fasting BG was 85 today and that is the average range.

## 2023-02-14 NOTE — Progress Notes (Signed)
   PRENATAL VISIT NOTE  Subjective:  Becky Cochran is a 32 y.o. [phone] at [redacted]w[redacted]d being seen today for ongoing prenatal care.  She is currently monitored for the following issues for this high-risk pregnancy and has Supervision of other normal pregnancy, antepartum; Maternal obesity affecting pregnancy, antepartum; and Gestational diabetes mellitus (GDM) affecting pregnancy, antepartum on their problem list.  Patient reports no complaints.  Contractions: Not present. Vag. Bleeding: None.  Movement: Present. Denies leaking of fluid.   The following portions of the patient's history were reviewed and updated as appropriate: allergies, current medications, past family history, past medical history, past social history, past surgical history and problem list.   Objective:   Vitals:   02/14/23 1546  BP: 101/61  Pulse: 62  Weight: 232 lb 9.6 oz (105.5 kg)    Fetal Status: Fetal Heart Rate (bpm): 146   Movement: Present     General:  Alert, oriented and cooperative. Patient is in no acute distress.  Skin: Skin is warm and dry. No rash noted.   Cardiovascular: Normal heart rate noted  Respiratory: Normal respiratory effort, no problems with respiration noted  Abdomen: Soft, gravid, appropriate for gestational age.  Pain/Pressure: Absent     Pelvic: Cervical exam deferred        Extremities: Normal range of motion.  Edema: None  Mental Status: Normal mood and affect. Normal behavior. Normal judgment and thought content.   Assessment and Plan:  Pregnancy: [id number] at [redacted]w[redacted]d 1. Supervision of other normal pregnancy, antepartum -- frequent and vigorous fetal movement  2. [redacted] weeks gestation of pregnancy - weekly visits  3. Gestational diabetes mellitus (GDM) affecting pregnancy, antepartum - Metformin BID - Blood sugar log reviewed with patient. All normal values. Patient provided for EHR/MyChart. - IOL requested for ~39 weeks  - NST is scheduled next week  Term labor symptoms and general  obstetric precautions including but not limited to vaginal bleeding, contractions, leaking of fluid and fetal movement were reviewed in detail with the patient. Please refer to After Visit Summary for other counseling recommendations.   Return in about 1 week (around 02/21/2023) for IN-PERSON, LOB.  Future Appointments  Date Time Provider Department Center  02/21/2023  2:15 PM [facility] NST [facility]  02/21/2023  3:30 PM Sue Lush, FNP [facility] None  02/28/2023  3:30 PM Sue Lush, FNP [facility] None  03/07/2023  3:30 PM Sue Lush, FNP [facility] None    Corlis Hove, NP

## 2023-02-14 NOTE — Procedures (Signed)
Becky Cochran September 28, 1990 [redacted]w[redacted]d  Fetus A Non-Stress Test Interpretation for 02/14/23   NST only  Indication: Gestational Diabetes medication controlled  Fetal Heart Rate A Mode: External Baseline Rate (A): 140 bpm Variability: Moderate Accelerations: 15 x 15 Decelerations: None Multiple birth?: No  Uterine Activity Mode: Palpation, Toco Contraction Frequency (min): none Resting Tone Palpated: Relaxed  Interpretation (Fetal Testing) Nonstress Test Interpretation: Reactive Overall Impression: Reassuring for gestational age Comments: Dr. Judeth Cornfield reviewed tracing

## 2023-02-19 ENCOUNTER — Encounter (HOSPITAL_COMMUNITY): Payer: Self-pay | Admitting: *Deleted

## 2023-02-19 ENCOUNTER — Telehealth (HOSPITAL_COMMUNITY): Payer: Self-pay | Admitting: *Deleted

## 2023-02-19 NOTE — Telephone Encounter (Signed)
Preadmission screen  

## 2023-02-21 ENCOUNTER — Ambulatory Visit: Payer: Medicaid Other | Attending: Maternal & Fetal Medicine | Admitting: *Deleted

## 2023-02-21 ENCOUNTER — Ambulatory Visit (INDEPENDENT_AMBULATORY_CARE_PROVIDER_SITE_OTHER): Payer: Medicaid Other | Admitting: Obstetrics and Gynecology

## 2023-02-21 ENCOUNTER — Encounter: Payer: Self-pay | Admitting: Obstetrics and Gynecology

## 2023-02-21 ENCOUNTER — Other Ambulatory Visit: Payer: Self-pay | Admitting: Advanced Practice Midwife

## 2023-02-21 VITALS — BP 123/72 | HR 65 | Wt 230.6 lb

## 2023-02-21 DIAGNOSIS — Z3493 Encounter for supervision of normal pregnancy, unspecified, third trimester: Secondary | ICD-10-CM

## 2023-02-21 DIAGNOSIS — Z3480 Encounter for supervision of other normal pregnancy, unspecified trimester: Secondary | ICD-10-CM

## 2023-02-21 DIAGNOSIS — Z3A38 38 weeks gestation of pregnancy: Secondary | ICD-10-CM

## 2023-02-21 DIAGNOSIS — O24419 Gestational diabetes mellitus in pregnancy, unspecified control: Secondary | ICD-10-CM

## 2023-02-21 NOTE — Procedures (Signed)
Becky Cochran 10/18/1990 [redacted]w[redacted]d  Fetus A Non-Stress Test Interpretation for 02/21/23--NST ONLY  Indication: Gestational Diabetes medication controlled  Fetal Heart Rate A Mode: External Baseline Rate (A): 135 bpm Variability: Moderate Accelerations: 15 x 15 Decelerations: None Multiple birth?: No  Uterine Activity Mode: Toco Contraction Frequency (min): 1 u/c during NST Contraction Duration (sec): 70 Contraction Quality: Mild Resting Tone Palpated: Relaxed  Interpretation (Fetal Testing) Nonstress Test Interpretation: Reactive Comments: Tracing reviewed byDr. Judeth Cornfield

## 2023-02-21 NOTE — Progress Notes (Signed)
Pt presents for ROB visit. Requesting cervical check. 

## 2023-02-21 NOTE — Progress Notes (Signed)
   PRENATAL VISIT NOTE  Subjective:  Becky Cochran is a 32 y.o. [phone] at [redacted]w[redacted]d being seen today for ongoing prenatal care.  She is currently monitored for the following issues for this high-risk pregnancy and has Supervision of other normal pregnancy, antepartum; Maternal obesity affecting pregnancy, antepartum; and Gestational diabetes mellitus (GDM) affecting pregnancy, antepartum on their problem list.  Patient reports no complaints.  Contractions: Irregular. Vag. Bleeding: None.  Movement: Present. Denies leaking of fluid.   The following portions of the patient's history were reviewed and updated as appropriate: allergies, current medications, past family history, past medical history, past social history, past surgical history and problem list.   Objective:   Vitals:   02/21/23 1548  BP: 123/72  Pulse: 65  Weight: 230 lb 9.6 oz (104.6 kg)    Fetal Status: Fetal Heart Rate (bpm): 162 Fundal Height: 38 cm Movement: Present  Presentation: Vertex  General:  Alert, oriented and cooperative. Patient is in no acute distress.  Skin: Skin is warm and dry. No rash noted.   Cardiovascular: Normal heart rate noted  Respiratory: Normal respiratory effort, no problems with respiration noted  Abdomen: Soft, gravid, appropriate for gestational age.  Pain/Pressure: Absent     Pelvic: Cervical exam performed in the presence of a chaperone Dilation: 1 Effacement (%): Thick Station: -3  Extremities: Normal range of motion.  Edema: None  Mental Status: Normal mood and affect. Normal behavior. Normal judgment and thought content.   Assessment and Plan:  Pregnancy: [id number] at [redacted]w[redacted]d  1. Supervision of other normal pregnancy, antepartum BP and FHR normal  Feeling regular fetal movement FH appropriate  2. Gestational diabetes mellitus (GDM) affecting pregnancy, antepartum Reports normal all within range, stopped taking metformin d/t gi effects and reports pp <120 and fastings in the 55s. 7/10  u/s EFW 42%, BPP 8/8 IOL 7/26  3. [redacted] weeks gestation of pregnancy Discussed IOL, methods of induction and pain management   Term labor symptoms and general obstetric precautions including but not limited to vaginal bleeding, contractions, leaking of fluid and fetal movement were reviewed in detail with the patient. Please refer to After Visit Summary for other counseling recommendations.     Future Appointments  Date Time Provider Department Center  02/23/2023  6:45 AM [facility] SCHED ROOM [facility] None    Albertine Grates, FNP

## 2023-02-23 ENCOUNTER — Encounter (HOSPITAL_COMMUNITY): Payer: Self-pay | Admitting: Obstetrics and Gynecology

## 2023-02-23 ENCOUNTER — Inpatient Hospital Stay (HOSPITAL_COMMUNITY)
Admission: RE | Admit: 2023-02-23 | Discharge: 2023-02-25 | DRG: 807 | Disposition: A | Discharge status: Home / Self Care | Payer: Medicaid Other | Attending: Family Medicine | Admitting: Family Medicine

## 2023-02-23 ENCOUNTER — Inpatient Hospital Stay (HOSPITAL_COMMUNITY): Payer: Medicaid Other

## 2023-02-23 DIAGNOSIS — O24419 Gestational diabetes mellitus in pregnancy, unspecified control: Secondary | ICD-10-CM | POA: Diagnosis present

## 2023-02-23 DIAGNOSIS — O26893 Other specified pregnancy related conditions, third trimester: Secondary | ICD-10-CM | POA: Diagnosis present

## 2023-02-23 DIAGNOSIS — Z23 Encounter for immunization: Secondary | ICD-10-CM

## 2023-02-23 DIAGNOSIS — R030 Elevated blood-pressure reading, without diagnosis of hypertension: Secondary | ICD-10-CM | POA: Diagnosis present

## 2023-02-23 DIAGNOSIS — Z3480 Encounter for supervision of other normal pregnancy, unspecified trimester: Principal | ICD-10-CM

## 2023-02-23 DIAGNOSIS — O24425 Gestational diabetes mellitus in childbirth, controlled by oral hypoglycemic drugs: Secondary | ICD-10-CM | POA: Diagnosis present

## 2023-02-23 DIAGNOSIS — O99214 Obesity complicating childbirth: Secondary | ICD-10-CM | POA: Diagnosis not present

## 2023-02-23 DIAGNOSIS — Z30017 Encounter for initial prescription of implantable subdermal contraceptive: Secondary | ICD-10-CM | POA: Diagnosis not present

## 2023-02-23 DIAGNOSIS — Z3A39 39 weeks gestation of pregnancy: Secondary | ICD-10-CM

## 2023-02-23 DIAGNOSIS — O24424 Gestational diabetes mellitus in childbirth, insulin controlled: Secondary | ICD-10-CM | POA: Diagnosis not present

## 2023-02-23 LAB — CBC
HCT: 30.8 % — ABNORMAL LOW (ref 36.0–46.0)
Hemoglobin: 10 g/dL — ABNORMAL LOW (ref 12.0–15.0)
MCH: 26.6 pg (ref 26.0–34.0)
MCHC: 32.5 g/dL (ref 30.0–36.0)
MCV: 81.9 fL (ref 80.0–100.0)
Platelets: 289 10*3/uL (ref 150–400)
RBC: 3.76 MIL/uL — ABNORMAL LOW (ref 3.87–5.11)
RDW: 13.9 % (ref 11.5–15.5)
WBC: 8.2 10*3/uL (ref 4.0–10.5)
nRBC: 0 % (ref 0.0–0.2)

## 2023-02-23 LAB — TYPE AND SCREEN

## 2023-02-23 MED ORDER — LACTATED RINGERS IV SOLN: INTRAVENOUS | Status: DC

## 2023-02-23 MED ORDER — OXYTOCIN-SODIUM CHLORIDE 30-0.9 UT/500ML-% IV SOLN
2.5000 [IU]/h | INTRAVENOUS | Status: DC
  Filled 2023-02-23: qty 500

## 2023-02-23 MED ORDER — FENTANYL CITRATE (PF) 100 MCG/2ML IJ SOLN
50.0000 ug | INTRAMUSCULAR | Status: DC | PRN
  Administered 2023-02-24: 100 ug via INTRAVENOUS
  Filled 2023-02-23: qty 2

## 2023-02-23 MED ORDER — OXYTOCIN BOLUS FROM INFUSION
333.0000 mL | Freq: Once | INTRAVENOUS | Status: AC
  Administered 2023-02-24: 333 mL via INTRAVENOUS

## 2023-02-23 MED ORDER — ONDANSETRON HCL 4 MG/2ML IJ SOLN: 4.0000 mg | Freq: Four times a day (QID) | INTRAMUSCULAR | Status: DC | PRN

## 2023-02-23 MED ORDER — LACTATED RINGERS IV SOLN: 500.0000 mL | INTRAVENOUS | Status: DC | PRN

## 2023-02-23 MED ORDER — ACETAMINOPHEN 325 MG PO TABS: 650.0000 mg | ORAL_TABLET | ORAL | Status: DC | PRN

## 2023-02-23 MED ORDER — SOD CITRATE-CITRIC ACID 500-334 MG/5ML PO SOLN: 30.0000 mL | ORAL | Status: DC | PRN

## 2023-02-23 MED ORDER — LIDOCAINE HCL (PF) 1 % IJ SOLN: 30.0000 mL | INTRAMUSCULAR | Status: DC | PRN

## 2023-02-23 NOTE — H&P (Signed)
OBSTETRIC ADMISSION HISTORY AND PHYSICAL  Becky Cochran is a 32 y.o. female [phone] with IUP at [redacted]w[redacted]d by LMP presenting for IOL 2/2 A2GDM. She reports +FMs, No LOF, no VB, no blurry vision, headaches or peripheral edema, and RUQ pain.  She plans on breast feeding. She request nexplanon for birth control. She received her prenatal care at  [facility]    Dating: By LMP --->  Estimated Date of Delivery: 03/02/23  Sono:    @[redacted]w[redacted]d , CWD, normal anatomy, cephalic presentation, anterior placental lie, 2951g, 42% EFW   Prenatal History/Complications:  -A2GDM (Metformin) -Obesity  Past Medical History: Past Medical History:  Diagnosis Date   Gestational diabetes    Medical history non-contributory     Past Surgical History: Past Surgical History:  Procedure Laterality Date   NO PAST SURGERIES      Obstetrical History: OB History     Gravida  5   Para  3   Term  3   Preterm      AB  1   Living  3      SAB      IAB      Ectopic      Multiple  0   Live Births  3           Social History Social History   Socioeconomic History   Marital status: Single    Spouse name: Madison   Number of children: 2   Years of education: current [employer]   Highest education level: Some college, no degree  Occupational History   Not on file  Tobacco Use   Smoking status: Never   Smokeless tobacco: Never  Vaping Use   Vaping status: Never Used  Substance and Sexual Activity   Alcohol use: Not Currently    Comment: socially   Drug use: Never   Sexual activity: Yes    Birth control/protection: None    Comment: last IC-last pm  Other Topics Concern   Not on file  Social History Narrative   Not on file   Social Determinants of Health   Financial Resource Strain: Low Risk  (07/08/2018)   Overall Financial Resource Strain (CARDIA)    Difficulty of Paying Living Expenses: Not very hard  Food Insecurity: No Food Insecurity (02/23/2023)   Hunger Vital Sign    Worried  About Running Out of Food in the Last Year: Never true    Ran Out of Food in the Last Year: Never true  Transportation Needs: No Transportation Needs (02/23/2023)   PRAPARE - Administrator, Civil Service (Medical): No    Lack of Transportation (Non-Medical): No  Physical Activity: Sufficiently Active (07/08/2018)   Exercise Vital Sign    Days of Exercise per Week: 3 days    Minutes of Exercise per Session: 60 min  Stress: No Stress Concern Present (07/08/2018)   [employer] of Occupational Health - Occupational Stress Questionnaire    Feeling of Stress : Only a little  Social Connections: Unknown (12/04/2021)   Received from [facility]   Social Network    Social Network: Not on file    Family History: Family History  Problem Relation Age of Onset   Diabetes Mother    Heart disease Mother    Kidney disease Mother    Hypertension Mother    Cancer Maternal Grandmother    Cancer Paternal Grandmother     Allergies: No Known Allergies  Medications Prior to Admission  Medication Sig Dispense Refill  Last Dose   metFORMIN (GLUCOPHAGE) 500 MG tablet Take 2 tablets (1,000 mg total) by mouth 2 (two) times daily with a meal. 60 tablet 1 Past Week   Prenatal Vit-Fe Fumarate-FA (MULTIVITAMIN-PRENATAL) 27-0.8 MG TABS tablet Take 1 tablet by mouth daily. 30 tablet 12 02/22/2023   Accu-Chek Softclix Lancets lancets Use to check capillary blood sugars four times a day 100 each 12    benzonatate (TESSALON) 100 MG capsule Take 1 capsule (100 mg total) by mouth every 8 (eight) hours. (Patient not taking: Reported on 09/04/2022) 30 capsule 0    Blood Glucose Monitoring Suppl (ACCU-CHEK GUIDE) w/Device KIT Use to check capillary blood sugars four times a day 1 kit 0    Blood Pressure Monitoring (BLOOD PRESSURE KIT) DEVI 1 Device by Does not apply route once a week. 1 each 0    glucose blood (ACCU-CHEK GUIDE) test strip Use to check capillary blood sugars four times a day 100 each 12       Review of Systems   All systems reviewed and negative except as stated in HPI  Blood pressure (!) 145/98, pulse (!) 102, height 5\' 6"  (1.676 m), weight 104.9 kg, last menstrual period 05/26/2022, unknown if currently breastfeeding. 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, alpha thal carrier Anatomy US wnl, female  Prenatal Transfer Tool  Maternal Diabetes: Yes:  Diabetes Type:  Insulin/Medication controlled 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, alpha thal carrier Maternal Ultrasounds/Referrals: Normal Fetal Ultrasounds or other Referrals:  None Maternal Substance Abuse:  No Significant Maternal Medications:  None Significant Maternal Lab Results:  Group B Strep negative Number of Prenatal Visits:greater than 3 verified prenatal visits Other Comments:  None  Results for orders placed or performed during the hospital encounter of 02/23/23 (from the past 24 hour(s))  Type and screen   Collection Time: 02/23/23 11:12 PM  Result Value Ref Range   ABO/RH(D) PENDING    Antibody Screen PENDING    Sample Expiration      02/26/2023,2359 Performed at [facility], [address], [place], [place] [address]     Patient Active Problem List   Diagnosis Date Noted   Gestational diabetes mellitus, antepartum 02/23/2023   Gestational diabetes mellitus (GDM) affecting pregnancy, antepartum 10/02/2022   Maternal obesity affecting pregnancy, antepartum 09/04/2022    Supervision of other normal pregnancy, antepartum 08/09/2022    Assessment/Plan:  Becky Cochran is a 32 y.o. [id number] at [redacted]w[redacted]d here for IOL 2/2 A2GDM (Metformin).   #Labor: FB placed with vaginal cytotec. Consider AROM at next cervical exam. The patient desires to avoid pitocin.  #Pain: Maternally supported #FWB: Cat I #ID: GBS neg #MOF: Breast #MOC: Nexplanon #Circ: Yes  A2GDM -Continue to monitor  BP elevated On admission. None prior.  -Continue to monitor, baseline preE labs collected   [facility], DO  02/23/2023, 11:36 PM

## 2023-02-24 ENCOUNTER — Encounter (HOSPITAL_COMMUNITY): Payer: Self-pay | Admitting: Obstetrics and Gynecology

## 2023-02-24 DIAGNOSIS — O99214 Obesity complicating childbirth: Secondary | ICD-10-CM

## 2023-02-24 DIAGNOSIS — Z3A39 39 weeks gestation of pregnancy: Secondary | ICD-10-CM

## 2023-02-24 DIAGNOSIS — O24424 Gestational diabetes mellitus in childbirth, insulin controlled: Secondary | ICD-10-CM

## 2023-02-24 LAB — COMPREHENSIVE METABOLIC PANEL
ALT: 13 U/L (ref 0–44)
AST: 17 U/L (ref 15–41)
Albumin: 2.9 g/dL — ABNORMAL LOW (ref 3.5–5.0)
Alkaline Phosphatase: 73 U/L (ref 38–126)
Anion gap: 12 (ref 5–15)
BUN: 10 mg/dL (ref 6–20)
CO2: 19 mmol/L — ABNORMAL LOW (ref 22–32)
Calcium: 8.9 mg/dL (ref 8.9–10.3)
Chloride: 101 mmol/L (ref 98–111)
Creatinine, Ser: 0.81 mg/dL (ref 0.44–1.00)
GFR, Estimated: >60 mL/min (ref >60)
Glucose, Bld: 137 mg/dL — ABNORMAL HIGH (ref 70–99)
Potassium: 3.3 mmol/L — ABNORMAL LOW (ref 3.5–5.1)
Sodium: 132 mmol/L — ABNORMAL LOW (ref 135–145)
Total Bilirubin: 0.4 mg/dL (ref 0.3–1.2)
Total Protein: 6.5 g/dL (ref 6.5–8.1)

## 2023-02-24 LAB — GLUCOSE, CAPILLARY
Glucose-Capillary: 101 mg/dL — ABNORMAL HIGH (ref 70–99)
Glucose-Capillary: 99 mg/dL (ref 70–99)

## 2023-02-24 MED ORDER — ACETAMINOPHEN 325 MG PO TABS
650.0000 mg | ORAL_TABLET | ORAL | Status: DC | PRN
  Administered 2023-02-24 – 2023-02-25 (×3): 650 mg via ORAL
  Filled 2023-02-24 (×3): qty 2

## 2023-02-24 MED ORDER — BENZOCAINE-MENTHOL 20-0.5 % EX AERO
1.0000 | INHALATION_SPRAY | CUTANEOUS | Status: DC | PRN
  Administered 2023-02-24: 1 via TOPICAL
  Filled 2023-02-24: qty 56

## 2023-02-24 MED ORDER — ONDANSETRON HCL 4 MG/2ML IJ SOLN: 4.0000 mg | INTRAMUSCULAR | Status: DC | PRN

## 2023-02-24 MED ORDER — MISOPROSTOL 25 MCG QUARTER TABLET
25.0000 ug | ORAL_TABLET | Freq: Once | ORAL | Status: AC
  Administered 2023-02-24: 25 ug via VAGINAL
  Filled 2023-02-24: qty 1

## 2023-02-24 MED ORDER — COCONUT OIL OIL: 1.0000 | TOPICAL_OIL | Status: DC | PRN

## 2023-02-24 MED ORDER — PRENATAL MULTIVITAMIN CH
1.0000 | ORAL_TABLET | Freq: Every day | ORAL | Status: DC
  Administered 2023-02-24 – 2023-02-25 (×2): 1 via ORAL
  Filled 2023-02-24 (×2): qty 1

## 2023-02-24 MED ORDER — OXYTOCIN-SODIUM CHLORIDE 30-0.9 UT/500ML-% IV SOLN
1.0000 m[IU]/min | INTRAVENOUS | Status: DC
  Administered 2023-02-24: 2 m[IU]/min via INTRAVENOUS

## 2023-02-24 MED ORDER — TERBUTALINE SULFATE 1 MG/ML IJ SOLN: 0.2500 mg | Freq: Once | INTRAMUSCULAR | Status: DC | PRN

## 2023-02-24 MED ORDER — DIPHENHYDRAMINE HCL 25 MG PO CAPS: 25.0000 mg | ORAL_CAPSULE | Freq: Four times a day (QID) | ORAL | Status: DC | PRN

## 2023-02-24 MED ORDER — TETANUS-DIPHTH-ACELL PERTUSSIS 5-2.5-18.5 LF-MCG/0.5 IM SUSY
0.5000 mL | PREFILLED_SYRINGE | Freq: Once | INTRAMUSCULAR | Status: AC
  Administered 2023-02-25: 0.5 mL via INTRAMUSCULAR
  Filled 2023-02-24: qty 0.5

## 2023-02-24 MED ORDER — ZOLPIDEM TARTRATE 5 MG PO TABS: 5.0000 mg | ORAL_TABLET | Freq: Every evening | ORAL | Status: DC | PRN

## 2023-02-24 MED ORDER — WITCH HAZEL-GLYCERIN EX PADS
1.0000 | MEDICATED_PAD | CUTANEOUS | Status: DC | PRN
  Administered 2023-02-25: 1 via TOPICAL

## 2023-02-24 MED ORDER — DIBUCAINE (PERIANAL) 1 % EX OINT
1.0000 | TOPICAL_OINTMENT | CUTANEOUS | Status: DC | PRN
  Administered 2023-02-25: 1 via RECTAL
  Filled 2023-02-24: qty 28

## 2023-02-24 MED ORDER — IBUPROFEN 600 MG PO TABS
600.0000 mg | ORAL_TABLET | Freq: Four times a day (QID) | ORAL | Status: DC
  Administered 2023-02-24 – 2023-02-25 (×6): 600 mg via ORAL
  Filled 2023-02-24 (×6): qty 1

## 2023-02-24 MED ORDER — MISOPROSTOL 25 MCG QUARTER TABLET: 25.0000 ug | ORAL_TABLET | ORAL | Status: DC | PRN

## 2023-02-24 MED ORDER — SIMETHICONE 80 MG PO CHEW: 80.0000 mg | CHEWABLE_TABLET | ORAL | Status: DC | PRN

## 2023-02-24 MED ORDER — SENNOSIDES-DOCUSATE SODIUM 8.6-50 MG PO TABS
2.0000 | ORAL_TABLET | Freq: Every day | ORAL | Status: DC
  Administered 2023-02-25: 2 via ORAL
  Filled 2023-02-24: qty 2

## 2023-02-24 MED ORDER — ONDANSETRON HCL 4 MG PO TABS: 4.0000 mg | ORAL_TABLET | ORAL | Status: DC | PRN

## 2023-02-24 NOTE — H&P (Incomplete)
OBSTETRIC ADMISSION HISTORY AND PHYSICAL  Becky Cochran is a 32 y.o. female [phone] with IUP at [redacted]w[redacted]d by LMP presenting for IOL 2/2 A2GDM. She reports +FMs, No LOF, no VB, no blurry vision, headaches or peripheral edema, and RUQ pain.  She plans on breast feeding. She request nexplanon for birth control. She received her prenatal care at  [facility]    Dating: By LMP --->  Estimated Date of Delivery: 03/02/23  Sono:    @[redacted]w[redacted]d , CWD, normal anatomy, cephalic presentation, anterior placental lie, 2951g, 42% EFW   Prenatal History/Complications:  -A2GDM (Metformin) -Obesity  Past Medical History: Past Medical History:  Diagnosis Date  . Gestational diabetes   . Medical history non-contributory     Past Surgical History: Past Surgical History:  Procedure Laterality Date  . NO PAST SURGERIES      Obstetrical History: OB History     Gravida  5   Para  3   Term  3   Preterm      AB  1   Living  3      SAB      IAB      Ectopic      Multiple  0   Live Births  3           Social History Social History   Socioeconomic History  . Marital status: Single    Spouse name: Madison  . Number of children: 2  . Years of education: current [employer]  . Highest education level: Some college, no degree  Occupational History  . Not on file  Tobacco Use  . Smoking status: Never  . Smokeless tobacco: Never  Vaping Use  . Vaping status: Never Used  Substance and Sexual Activity  . Alcohol use: Not Currently    Comment: socially  . Drug use: Never  . Sexual activity: Yes    Birth control/protection: None    Comment: last IC-last pm  Other Topics Concern  . Not on file  Social History Narrative  . Not on file   Social Determinants of Health   Financial Resource Strain: Low Risk  (07/08/2018)   Overall Financial Resource Strain (CARDIA)   . Difficulty of Paying Living Expenses: Not very hard  Food Insecurity: No Food Insecurity (02/23/2023)   Hunger Vital  Sign   . Worried About Programme researcher, broadcasting/film/video in the Last Year: Never true   . Ran Out of Food in the Last Year: Never true  Transportation Needs: No Transportation Needs (02/23/2023)   PRAPARE - Transportation   . Lack of Transportation (Medical): No   . Lack of Transportation (Non-Medical): No  Physical Activity: Sufficiently Active (07/08/2018)   Exercise Vital Sign   . Days of Exercise per Week: 3 days   . Minutes of Exercise per Session: 60 min  Stress: No Stress Concern Present (07/08/2018)   [employer] of Occupational Health - Occupational Stress Questionnaire   . Feeling of Stress : Only a little  Social Connections: Unknown (12/04/2021)   Received from [facility]   Social Network   . Social Network: Not on file    Family History: Family History  Problem Relation Age of Onset  . Diabetes Mother   . Heart disease Mother   . Kidney disease Mother   . Hypertension Mother   . Cancer Maternal Grandmother   . Cancer Paternal Grandmother     Allergies: No Known Allergies  Medications Prior to Admission  Medication Sig Dispense Refill  Last Dose  . metFORMIN (GLUCOPHAGE) 500 MG tablet Take 2 tablets (1,000 mg total) by mouth 2 (two) times daily with a meal. 60 tablet 1 Past Week  . Prenatal Vit-Fe Fumarate-FA (MULTIVITAMIN-PRENATAL) 27-0.8 MG TABS tablet Take 1 tablet by mouth daily. 30 tablet 12 02/22/2023  . Accu-Chek Softclix Lancets lancets Use to check capillary blood sugars four times a day 100 each 12   . benzonatate (TESSALON) 100 MG capsule Take 1 capsule (100 mg total) by mouth every 8 (eight) hours. (Patient not taking: Reported on 09/04/2022) 30 capsule 0   . Blood Glucose Monitoring Suppl (ACCU-CHEK GUIDE) w/Device KIT Use to check capillary blood sugars four times a day 1 kit 0   . Blood Pressure Monitoring (BLOOD PRESSURE KIT) DEVI 1 Device by Does not apply route once a week. 1 each 0   . glucose blood (ACCU-CHEK GUIDE) test strip Use to check capillary blood  sugars four times a day 100 each 12      Review of Systems   All systems reviewed and negative except as stated in HPI  Blood pressure (!) 145/98, pulse (!) 102, height 5\' 6"  (1.676 m), weight 104.9 kg, last menstrual period 05/26/2022, unknown if currently breastfeeding. General appearance: alert and no distress Lungs: normal effort Heart: regular rate noted Abdomen: gravid Extremities: No LE edema Presentation: cephalic Fetal monitoringBaseline: 155 bpm, Variability: Good {> 6 bpm), Accelerations: Reactive, and Decelerations: Absent Uterine activityNone     Prenatal labs: ABO, Rh: --/--/PENDING (07/26 2312) Antibody: PENDING (07/26 2312) Rubella: 5.80 (01/26 0941) RPR: Non Reactive (04/29 1519)  HBsAg: Negative (01/26 0941)  HIV: Non Reactive (04/29 1519)  GBS: Negative/-- (07/08 1506)  1 hr Glucola 201 Genetic screening  *** Anatomy US ***  Prenatal Transfer Tool  Maternal Diabetes: {Maternal Diabetes:3043596} Genetic Screening: {Genetic Screening:20205} Maternal Ultrasounds/Referrals: {Maternal Ultrasounds / Referrals:20211} Fetal Ultrasounds or other Referrals:  {Fetal Ultrasounds or Other Referrals:20213} Maternal Substance Abuse:  {Maternal Substance Abuse:20223} Significant Maternal Medications:  {Significant Maternal Meds:20233} Significant Maternal Lab Results:  {Significant Maternal Lab Results:20235} Number of Prenatal Visits:{Prenatal Visits:27860} Other Comments:  {Other Comments:20251}  Results for orders placed or performed during the hospital encounter of 02/23/23 (from the past 24 hour(s))  Type and screen   Collection Time: 02/23/23 11:12 PM  Result Value Ref Range   ABO/RH(D) PENDING    Antibody Screen PENDING    Sample Expiration      02/26/2023,2359 Performed at [facility], [address], [place], [place] [address]     Patient Active Problem List   Diagnosis Date Noted  . Gestational diabetes mellitus, antepartum 02/23/2023  .  Gestational diabetes mellitus (GDM) affecting pregnancy, antepartum 10/02/2022  . Maternal obesity affecting pregnancy, antepartum 09/04/2022  . Supervision of other normal pregnancy, antepartum 08/09/2022    Assessment/Plan:  Becky Cochran is a 32 y.o. [id number] at [redacted]w[redacted]d here for***  #Labor:*** #Pain: *** #FWB: *** #ID:  *** #MOF: *** #MOC:*** #Circ:  ***  Becky Pomeroy Autry-Lott, DO  02/23/2023, 11:36 PM

## 2023-02-24 NOTE — Progress Notes (Signed)
Mom eating, baby being checked by nursery at this time

## 2023-02-24 NOTE — Progress Notes (Signed)
Labor Progress Note Becky Cochran is a 32 y.o. [id number] at [redacted]w[redacted]d presented for IOL 2/2 A2GDM.   S: Having some discomfort with contractions.   O:  BP 116/60   Pulse (!) 53   Temp 98 F (36.7 C) (Oral)   Resp 17   Ht 5\' 6"  (1.676 m)   Wt 104.9 kg   LMP 05/26/2022 (Exact Date)   BMI 37.32 kg/m  EFM: 130bpm/moderate/+accels, no decels  CVE: Dilation: 3.5 Effacement (%): Thick Station: -3 Presentation: Vertex Exam by:: Autry-Lott, DO   A&P: 32 y.o. [id number] [redacted]w[redacted]d here for IOL 2/2 A2GDM #Labor: Progressing well. S/p FB and vaginal cytotec. Start pitocin 2 by 2. Favor AROM at next exam.  #Pain: maternally supported #FWB: Cat I  #GBS negative  A2GDM CBG 101 -Continue to monitor  BP elevated On admission. None prior. Wnl now.  Follow up PCR.   Tayana Shankle Autry-Lott, DO 5:32 AM

## 2023-02-24 NOTE — Discharge Summary (Addendum)
Postpartum Discharge Summary      [name] DOB: 02-Nov-1990 MRN: [id number]  Date of admission: 02/23/2023 Delivery date:02/24/2023 Delivering provider: Lavonda Jumbo Date of discharge: 02/25/2023  Admitting diagnosis: Gestational diabetes mellitus, antepartum [O24.419] Intrauterine pregnancy: [redacted]w[redacted]d     Secondary diagnosis:  Principal Problem:   Gestational diabetes mellitus, antepartum  Additional problems: none    Discharge diagnosis: Term Pregnancy Delivered and GDM A2                                              Post partum procedures: Nexplanon Augmentation: AROM, Pitocin, Cytotec, and IP Foley Complications: None  Hospital course: Induction of Labor With Vaginal Delivery   32 y.o. yo [id number] at [redacted]w[redacted]d was admitted to the hospital 02/23/2023 for induction of labor.  Indication for induction: A2 DM.  Patient had an labor course complicated bynone Membrane Rupture Time/Date: 5:46 AM,02/24/2023  Delivery Method:Vaginal, Spontaneous Episiotomy: None Lacerations:  None Details of delivery can be found in separate delivery note.  Patient had a postpartum course complicated bynone. Patient is discharged home 02/25/23.  Newborn Data: Birth date:02/24/2023 Birth time:7:03 AM Gender:Female Living status:Living Apgars:9 ,9  Weight:3330 g  Magnesium Sulfate received: No BMZ received: No Rhophylac:N/A MMR:N/A T-DaP: n/a Flu: N/A Transfusion:No  Physical exam  Vitals:   02/24/23 1534 02/24/23 1744 02/24/23 2257 02/25/23 0632  BP: 109/70 122/76 105/62 114/71  Pulse: (!) 58 62 (!) 56 (!) 49  Resp:  16 18 18   Temp:  98 F (36.7 C) 98.8 F (37.1 C) 98 F (36.7 C)  TempSrc:  Oral Oral Oral  SpO2:  100%    Weight:      Height:       General: alert, cooperative, and no distress Lochia: appropriate Uterine Fundus: firm DVT Evaluation: No evidence of DVT seen on physical exam. Labs: Lab Results  Component Value Date   WBC 8.2 02/23/2023   HGB  10.0 (L) 02/23/2023   HCT 30.8 (L) 02/23/2023   MCV 81.9 02/23/2023   PLT 289 02/23/2023      Latest Ref Rng & Units 02/23/2023   11:15 PM  CMP  Glucose 70 - 99 mg/dL 644   BUN 6 - 20 mg/dL 10   Creatinine 0.34 - 1.00 mg/dL 7.42   Sodium 595 - 638 mmol/L 132   Potassium 3.5 - 5.1 mmol/L 3.3   Chloride 98 - 111 mmol/L 101   CO2 22 - 32 mmol/L 19   Calcium 8.9 - 10.3 mg/dL 8.9   Total Protein 6.5 - 8.1 g/dL 6.5   Total Bilirubin 0.3 - 1.2 mg/dL 0.4   Alkaline Phos 38 - 126 U/L 73   AST 15 - 41 U/L 17   ALT 0 - 44 U/L 13    Edinburgh Score:    02/24/2023    2:23 PM  Edinburgh Postnatal Depression Scale Screening Tool  I have been able to laugh and see the funny side of things. 0  I have looked forward with enjoyment to things. 0  I have blamed myself unnecessarily when things went wrong. 0  I have been anxious or worried for no good reason. 0  I have felt scared or panicky for no good reason. 0  Things have been getting on top of me. 1  I have been so unhappy that I have had difficulty  sleeping. 0  I have felt sad or miserable. 0  I have been so unhappy that I have been crying. 0  The thought of harming myself has occurred to me. 0  Edinburgh Postnatal Depression Scale Total 1     After visit meds:  Allergies as of 02/25/2023   No Known Allergies      Medication List     STOP taking these medications    Accu-Chek Softclix Lancets lancets   metFORMIN 500 MG tablet Commonly known as: GLUCOPHAGE       TAKE these medications    Accu-Chek Guide test strip Generic drug: glucose blood Use to check capillary blood sugars four times a day   Accu-Chek Guide w/Device Kit Use to check capillary blood sugars four times a day   benzonatate 100 MG capsule Commonly known as: TESSALON Take 1 capsule (100 mg total) by mouth every 8 (eight) hours.   Blood Pressure Kit Devi 1 Device by Does not apply route once a week.   furosemide 20 MG tablet Commonly known as:  Lasix Take 1 tablet (20 mg total) by mouth daily for 4 days.   ibuprofen 600 MG tablet Commonly known as: ADVIL Take 1 tablet (600 mg total) by mouth every 6 (six) hours.   multivitamin-prenatal 27-0.8 MG Tabs tablet Take 1 tablet by mouth daily.         Discharge home in stable condition Infant Feeding: Breast Infant Disposition:home with mother Discharge instruction: per After Visit Summary and Postpartum booklet. Activity: Advance as tolerated. Pelvic rest for 6 weeks.  Diet: routine diet Future Appointments:No future appointments. Follow up Visit:  Follow-up Information     [facility] Follow up in 4 day(s).   Specialty: Obstetrics and Gynecology Why: pp check, they will call you with an appointment Contact information: [address], [address] [place] [address] [phone]                 Please schedule this patient for a In person postpartum visit in 4 weeks with the following provider: Any provider. Additional Postpartum F/U: none   High risk pregnancy complicated by: GDM Delivery mode:  Vaginal, Spontaneous Anticipated Birth Control:  Nexplanon   02/25/2023 Reva Bores, MD

## 2023-02-25 DIAGNOSIS — Z30017 Encounter for initial prescription of implantable subdermal contraceptive: Secondary | ICD-10-CM

## 2023-02-25 MED ORDER — ETONOGESTREL 68 MG ~~LOC~~ IMPL
68.0000 mg | DRUG_IMPLANT | Freq: Once | SUBCUTANEOUS | Status: AC
  Administered 2023-02-25: 68 mg via SUBCUTANEOUS
  Filled 2023-02-25: qty 1

## 2023-02-25 MED ORDER — FUROSEMIDE 20 MG PO TABS: 20.0000 mg | ORAL_TABLET | Freq: Every day | ORAL | 0 refills | Status: AC

## 2023-02-25 MED ORDER — IBUPROFEN 600 MG PO TABS: 600.0000 mg | ORAL_TABLET | Freq: Four times a day (QID) | ORAL | 0 refills | Status: AC

## 2023-02-25 MED ORDER — LIDOCAINE HCL 1 % IJ SOLN
0.0000 mL | Freq: Once | INTRAMUSCULAR | Status: AC | PRN
  Administered 2023-02-25: 3 mL via INTRADERMAL
  Filled 2023-02-25: qty 20

## 2023-02-25 NOTE — Procedures (Signed)
Nexplanon Insertion Procedure Patient identified, informed consent performed, consent signed.   Patient does understand that irregular bleeding is a very common side effect of this medication. She was advised to have backup contraception for one week after placement. Pregnancy test in clinic today was negative.  Appropriate time out taken.  Patient's left arm was prepped and draped in the usual sterile fashion. The ruler used to measure and mark insertion area.  Patient was prepped with alcohol swab and then injected with 3 ml of 1% lidocaine.  She was prepped with betadine, Nexplanon removed from packaging,  Device confirmed in needle, then inserted full length of needle and withdrawn per handbook instructions. Nexplanon was able to palpated in the patient's arm; patient palpated the insert herself. There was minimal blood loss.  Patient insertion site covered with guaze and a pressure bandage to reduce any bruising.  The patient tolerated the procedure well and was given post procedure instructions.   LOT: [id number] Exp: 20-2542

## 2023-02-25 NOTE — Progress Notes (Addendum)
Post Partum Day 1 Subjective: no complaints, up ad lib, and voiding  Objective: Blood pressure 114/71, pulse (!) 49, temperature 98 F (36.7 C), temperature source Oral, resp. rate 18, height 5\' 6"  (1.676 m), weight 104.9 kg, last menstrual period 05/26/2022, SpO2 100%, unknown if currently breastfeeding.  Physical Exam:  General: alert, cooperative, and appears stated age 32: appropriate Uterine Fundus: firm DVT Evaluation: No evidence of DVT seen on physical exam.  Recent Labs    02/23/23 2315  HGB 10.0*  HCT 30.8*    Assessment/Plan: Discharge home, Breastfeeding, and Circumcision prior to discharge Nexplanon prior to discharge  There are many reasons parents decide to have their sons circumsized. During the first year of life circumcised males have a reduced risk of urinary tract infections but after this year the rates between circumcised males and uncircumcised males are the same.  It can reduce rate of HIV and other STI infection later in life.  It is safe to have your son circumcised outside of the hospital and the places above perform them regularly.   Deciding about Circumcision in Baby Boys  (Up-to-date The Basics)  What is circumcision?   Circumcision is a surgery that removes the skin that covers the tip of the penis, called the "foreskin" Circumcision is usually done when a boy is between 46 and 67 days old. In the [place], circumcision is common. In some other countries, fewer boys are circumcised. Circumcision is a common tradition in some religions.  Should I have my baby boy circumcised?   There is no easy answer. Circumcision has some benefits. But it also has risks. After talking with your doctor, you will have to decide for yourself what is right for your family.  What are the benefits of circumcision?   Circumcised boys seem to have slightly lower rates of: ?Urinary tract infections ?Swelling of the opening at the tip of the penis Circumcised  men seem to have slightly lower rates of: ?Urinary tract infections ?Swelling of the opening at the tip of the penis ?Penis cancer ?HIV and other infections that you catch during sex, or transmit to a future partner(s) ?Cervical cancer in the women they have sex with Even so, in the [place], the risks of these problems are small - even in boys and men who have not been circumcised. Plus, boys and men who are not circumcised can reduce these extra risks by: ?Cleaning their penis well ?Using condoms during sex  What are the risks of circumcision?  Risks include: ?Bleeding or infection from the surgery ?Damage to or amputation of the penis ?A chance that the doctor will cut off too much or not enough of the foreskin ?A chance that sex won't feel as good later in life Only about 1 out of every 200 circumcisions leads to problems. There is also a chance that your health insurance won't pay for circumcision.  How is circumcision done in baby boys?  First, the baby gets medicine for pain relief. This might be a cream on the skin or a shot into the base of the penis. Next, the doctor cleans the baby's penis well. Then he or she uses special tools to cut off the foreskin. Finally, the doctor wraps a bandage (called gauze) around the baby's penis. If you have your baby circumcised, his doctor or nurse will give you instructions on how to care for him after the surgery. It is important that you follow those instructions carefully.   LOS: 2 days  Reva Bores, MD 02/25/2023, 7:54 AM

## 2023-02-28 ENCOUNTER — Encounter: Payer: Medicaid Other | Admitting: Obstetrics and Gynecology

## 2023-03-07 ENCOUNTER — Encounter: Payer: Medicaid Other | Admitting: Obstetrics and Gynecology

## 2023-03-26 ENCOUNTER — Telehealth (HOSPITAL_COMMUNITY): Payer: Self-pay

## 2023-03-26 NOTE — Telephone Encounter (Signed)
03/26/2023 1324  Name: Becky Cochran MRN: [id number] DOB: [birth year]  Reason for Call:  Transition of Care Hospital Discharge Call  Contact Status: Patient Contact Status: Complete  Language assistant needed: Interpreter Mode: Interpreter Not Needed        Follow-Up Questions: Do You Have Any Concerns About Your Health As You Heal From Delivery?: Yes What Concerns Do You Have About Your Health?: Patient states that she has some soreness in her pelvis. Patient states that she has an appointment with her OB-GYN next week. RN told patient to discuss her concern with her OB. Patient has no other concerns or questions. Do You Have Any Concerns About Your Infants Health?: No  Edinburgh Postnatal Depression Scale:  In the Past 7 Days: I have been able to laugh and see the funny side of things.: As much as I always could I have looked forward with enjoyment to things.: As much as I ever did I have blamed myself unnecessarily when things went wrong.: No, never I have been anxious or worried for no good reason.: No, not at all I have felt scared or panicky for no good reason.: No, not at all Things have been getting on top of me.: No, I have been coping as well as ever I have been so unhappy that I have had difficulty sleeping.: Not at all I have felt sad or miserable.: No, not at all I have been so unhappy that I have been crying.: No, never The thought of harming myself has occurred to me.: Never Inocente Salles Postnatal Depression Scale Total: 0  PHQ2-9 Depression Scale:     Discharge Follow-up: Edinburgh score requires follow up?: No Patient was advised of the following resources:: Support Group, Breastfeeding Support Group Did patient express any COVID concerns?: No  Post-discharge interventions: Reviewed Newborn Safe Sleep Practices  Signature  Signe Colt

## 2023-04-04 ENCOUNTER — Ambulatory Visit (INDEPENDENT_AMBULATORY_CARE_PROVIDER_SITE_OTHER): Payer: Medicaid Other | Admitting: Advanced Practice Midwife

## 2023-04-04 VITALS — BP 116/72 | HR 63 | Ht 66.0 in | Wt 206.6 lb

## 2023-04-04 DIAGNOSIS — O9089 Other complications of the puerperium, not elsewhere classified: Secondary | ICD-10-CM | POA: Insufficient documentation

## 2023-04-04 DIAGNOSIS — O24419 Gestational diabetes mellitus in pregnancy, unspecified control: Secondary | ICD-10-CM

## 2023-04-04 DIAGNOSIS — R102 Pelvic and perineal pain: Secondary | ICD-10-CM | POA: Diagnosis not present

## 2023-04-04 DIAGNOSIS — Z8632 Personal history of gestational diabetes: Secondary | ICD-10-CM | POA: Diagnosis not present

## 2023-04-04 NOTE — Progress Notes (Signed)
    Post Partum Visit Note  Becky Cochran is a 32 y.o. [phone] female who presents for a postpartum visit. She is 5 weeks postpartum following a normal spontaneous vaginal delivery.  I have fully reviewed the prenatal and intrapartum course. The delivery was at 39 gestational weeks.  Anesthesia: none. Postpartum course has been good, pt c/o pelvic pain. Baby is doing well. Baby is feeding by breast. Bleeding no bleeding. Bowel function is normal. Bladder function is normal. Patient is sexually active. Contraception method is Nexplanon. Postpartum depression screening: negative.   The pregnancy intention screening data noted above was reviewed. Potential methods of contraception were discussed. The patient elected to proceed with No data recorded.   Edinburgh Postnatal Depression Scale - 04/04/23 1322       Edinburgh Postnatal Depression Scale:  In the Past 7 Days   I have been able to laugh and see the funny side of things. 0    I have looked forward with enjoyment to things. 0    I have blamed myself unnecessarily when things went wrong. 0    I have been anxious or worried for no good reason. 0    I have felt scared or panicky for no good reason. 0    Things have been getting on top of me. 0    I have been so unhappy that I have had difficulty sleeping. 0    I have felt sad or miserable. 0    I have been so unhappy that I have been crying. 0    The thought of harming myself has occurred to me. 0    Edinburgh Postnatal Depression Scale Total 0             Health Maintenance Due  Topic Date Due   FOOT EXAM  Never done   OPHTHALMOLOGY EXAM  Never done   Diabetic kidney evaluation - Urine ACR  01/29/2020   HEMOGLOBIN A1C  02/23/2023   INFLUENZA VACCINE  Never done   COVID-19 Vaccine (1 - 2023-24 season) Never done    The following portions of the patient's history were reviewed and updated as appropriate: allergies, current medications, past family history, past medical history,  past social history, past surgical history, and problem list.  Review of Systems Pertinent items noted in HPI and remainder of comprehensive ROS otherwise negative.  Objective:  BP 116/72   Pulse 63   Ht 5\' 6"  (1.676 m)   Wt 206 lb 9.6 oz (93.7 kg)   LMP 05/26/2022 (Exact Date)   Breastfeeding Yes   BMI 33.35 kg/m         Assessment/Plan:   1. Postpartum examination following vaginal delivery --Doing well, good support at home, bonding well with baby  2. Female pelvic pain -Hip/pelvic pain and back spasms since delivery.  - AMB referral to rehabilitation  3. Postpartum pain  - AMB referral to rehabilitation   4. Gestational diabetes mellitus (GDM) affecting pregnancy, antepartum --Schedule 2 hour GTT   Sharen Counter, CNM [facility] [employer], [facility]

## 2023-05-07 ENCOUNTER — Ambulatory Visit: Payer: Medicaid Other | Attending: Advanced Practice Midwife

## 2023-05-22 ENCOUNTER — Ambulatory Visit: Payer: Medicaid Other

## 2024-01-22 ENCOUNTER — Telehealth: Payer: Self-pay

## 2024-01-22 ENCOUNTER — Ambulatory Visit: Attending: Physician Assistant

## 2024-01-22 NOTE — Telephone Encounter (Signed)
 Called and left message after no-show appointment on 01/22/24.

## 2024-01-22 NOTE — Therapy (Incomplete)
 OUTPATIENT PHYSICAL THERAPY FEMALE PELVIC EVALUATION   [name] MRN: [id number] DOB:Jul 16, 1991, 33 y.o., female Today's Date: 01/22/2024  END OF SESSION:   Past Medical History:  Diagnosis Date   Gestational diabetes    Medical history non-contributory    Past Surgical History:  Procedure Laterality Date   NO PAST SURGERIES     Patient Active Problem List   Diagnosis Date Noted   History of gestational diabetes 04/04/2023   Postpartum pain 04/04/2023   Encounter for initial prescription of implantable subdermal contraceptive 02/25/2023    PCP: Alger Gong, MD  REFERRING PROVIDER: Kline, Chianne, PA-C   REFERRING DIAG: R10.2 (ICD-10-CM) - Pelvic and perineal pain  THERAPY DIAG:  No diagnosis found.  Rationale for Evaluation and Treatment: Rehabilitation ONSET DATE: ***  SUBJECTIVE:                                                                                                                                                                                           SUBJECTIVE STATEMENT: G5P4   PAIN:  Are you having pain? {yes/no:20286} NPRS scale: ***/10 Pain location: {pelvic pain location:27098}  Pain type: {type:313116} Pain description: {PAIN DESCRIPTION:21022940}   Aggravating factors: *** Relieving factors: ***  PRECAUTIONS: None  RED FLAGS: None   WEIGHT BEARING RESTRICTIONS: No  FALLS:  Has patient fallen in last 6 months? No  OCCUPATION: ***  ACTIVITY LEVEL : ***  PLOF: Independent  PATIENT GOALS: ***  PERTINENT HISTORY:  *** Sexual abuse: {Yes/No:304960894}  BOWEL MOVEMENT: Pain with bowel movement: {yes/no:20286} Type of bowel movement:{PT BM type:27100} Fully empty rectum: {No/Yes:304960894} Leakage: {Yes/No:304960894} Pads: {Yes/No:304960894} Fiber supplement/laxative {YES/NO AS:20300}  URINATION: Pain with urination: {yes/no:20286} Fully empty bladder: {Yes/No:304960894} Stream: {PT  urination:27102} Urgency: {YES/NO AS:20300} Frequency: *** Fluid Intake:  Leakage: {PT leakage:27103} Pads: {Yes/No:[id number]}  INTERCOURSE:  Ability to have vaginal penetration {YES/NO:21197} Pain with intercourse: {pain with intercourse PA:27099} Dryness{YES/NO AS:20300} Climax: *** Marinoff Scale: ***/3 Lubricant:  PREGNANCY: Vaginal deliveries *** Tearing {Yes***/No:[id number]} Episiotomy {YES/NO AS:20300} C-section deliveries *** Currently pregnant {Yes***/No:[id number]}  PROLAPSE: {PT prolapse:27101}   OBJECTIVE:  Note: Objective measures were completed at Evaluation unless otherwise noted.   PATIENT SURVEYS:   PFIQ-7: ***  COGNITION: Overall cognitive status: Within functional limits for tasks assessed     SENSATION: Light touch: Appears intact   FUNCTIONAL TESTS:  Squat: Single leg stance:  Rt:  Lt: Curl-up test:   GAIT: Assistive device utilized: {Assistive devices:23999} Comments: ***  POSTURE: {posture:25561}   LUMBARAROM/PROM:  A/PROM A/PROM  Eval (% available)  Flexion   Extension   Right lateral flexion   Left lateral flexion  Right rotation   Left rotation    (Blank rows = not tested)  PALPATION:   General: ***  Pelvic Alignment: ***  Abdominal: ***                External Perineal Exam: ***                             Internal Pelvic Floor: ***  Patient confirms identification and approves PT to assess internal pelvic floor and treatment {yes/no:20286}  PELVIC MMT:   MMT eval  Vaginal   Internal Anal Sphincter   External Anal Sphincter   Puborectalis   Diastasis Recti   (Blank rows = not tested)        TONE: ***  PROLAPSE: ***  TODAY'S TREATMENT:                                                                                                                              DATE:  12/05/23  EVAL  Manual:  Neuromuscular re-education:  Exercises:  Therapeutic activities:     PATIENT EDUCATION:   Education details: See above Person educated: Patient Education method: Explanation, Demonstration, Tactile cues, Verbal cues, and Handouts Education comprehension: verbalized understanding  HOME EXERCISE PROGRAM: ***  ASSESSMENT:  CLINICAL IMPRESSION: Patient is a 33 y.o. female who was seen today for physical therapy evaluation and treatment for ***.   OBJECTIVE IMPAIRMENTS: decreased activity tolerance, decreased coordination, decreased endurance, decreased mobility, decreased ROM, decreased strength, increased fascial restrictions, increased muscle spasms, impaired flexibility, impaired tone, improper body mechanics, postural dysfunction, and pain.   ACTIVITY LIMITATIONS: {activitylimitations:27494}  PARTICIPATION LIMITATIONS: {participationrestrictions:25113}  PERSONAL FACTORS: {Personal factors:25162} are also affecting patient's functional outcome.   REHAB POTENTIAL: {rehabpotential:25112}  CLINICAL DECISION MAKING: {clinical decision making:25114}  EVALUATION COMPLEXITY: {Evaluation complexity:25115}   GOALS: Goals reviewed with patient? Yes  SHORT TERM GOALS: Target date: 02/19/2024   Pt will be independent with HEP.   Baseline: Goal status: INITIAL  2.  *** Baseline:  Goal status: INITIAL  3.  *** Baseline:  Goal status: INITIAL  4.  *** Baseline:  Goal status: INITIAL  5.  *** Baseline:  Goal status: INITIAL  6.  *** Baseline:  Goal status: INITIAL  LONG TERM GOALS: Target date: ***  Pt will be independent with advanced HEP.   Baseline:  Goal status: INITIAL  2.  *** Baseline:  Goal status: INITIAL  3.  *** Baseline:  Goal status: INITIAL  4.  *** Baseline:  Goal status: INITIAL  5.  *** Baseline:  Goal status: INITIAL  6.  *** Baseline:  Goal status: INITIAL  PLAN:  PT FREQUENCY: 1-2x/week  PT DURATION: 6 months  PLANNED INTERVENTIONS: 97110-Therapeutic exercises, 97530- Therapeutic activity, 97112- Neuromuscular  re-education, 97535- Self Care, 02859- Manual therapy, Dry Needling, and Biofeedback  PLAN FOR NEXT SESSION: ***  Josette Mares, PT, DPT06/24/259:37 AM

## 2024-01-29 ENCOUNTER — Ambulatory Visit: Admitting: Obstetrics and Gynecology
